# Patient Record
Sex: Male | Born: 2000 | Race: Black or African American | Hispanic: No | Marital: Single | Smoking: Never smoker
Health system: Southern US, Community
[De-identification: ages and names within clinical notes are randomized; demographics above are authoritative.]

## PROBLEM LIST (undated history)

## (undated) DIAGNOSIS — K5090 Crohn's disease, unspecified, without complications: Secondary | ICD-10-CM

## (undated) DIAGNOSIS — K589 Irritable bowel syndrome without diarrhea: Secondary | ICD-10-CM

## (undated) HISTORY — PX: FOOT SURGERY: SHX648

## (undated) HISTORY — PX: COLONOSCOPY: SHX174

---
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
## 2019-01-27 IMAGING — CR DG SI JOINTS 3+V
4 series · 4 of 4 positions shown · non-contrast
Comparison: Lumbar spine 06/19/2016.

CLINICAL DATA: Back pain.

EXAM:
BILATERAL SACROILIAC JOINTS - 3+ VIEW

[si joints ap]
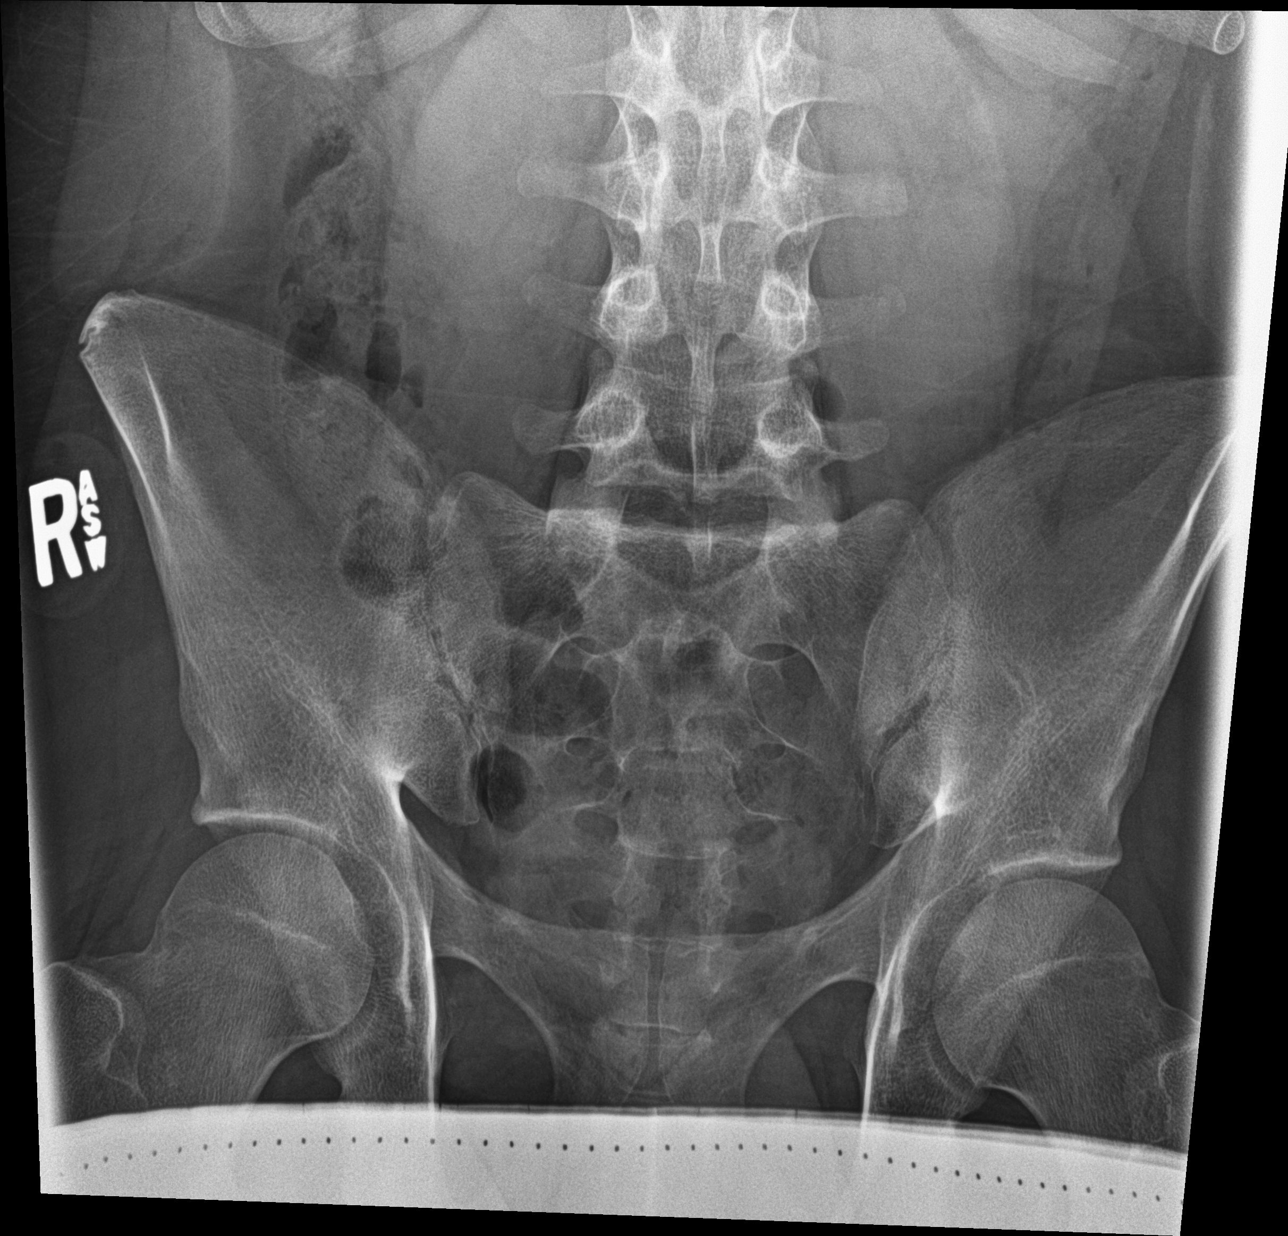

[si joints obl (1 of 3)]
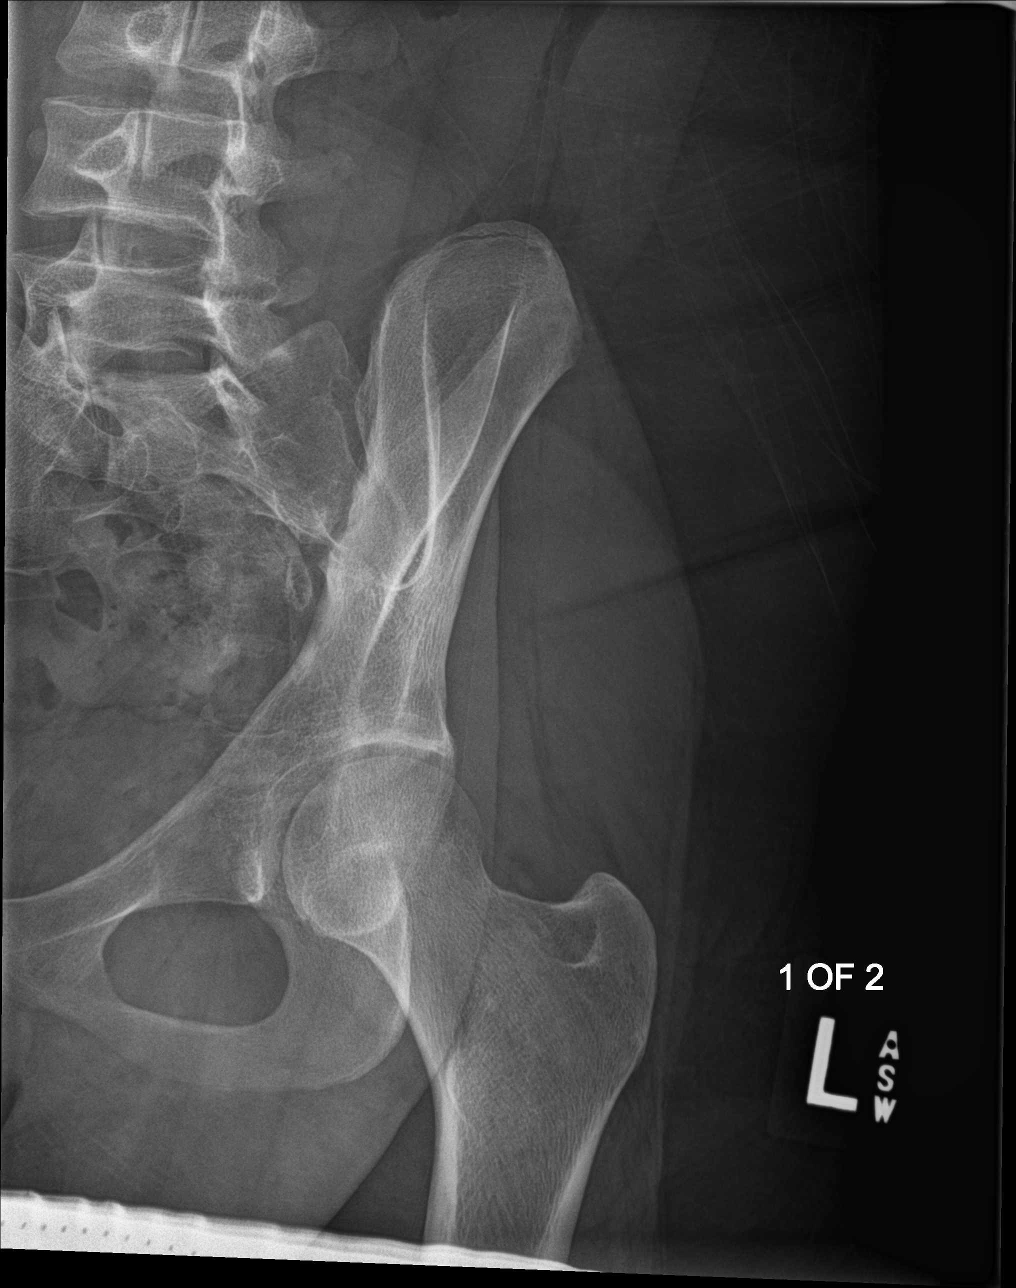

[si joints obl (2 of 3)]
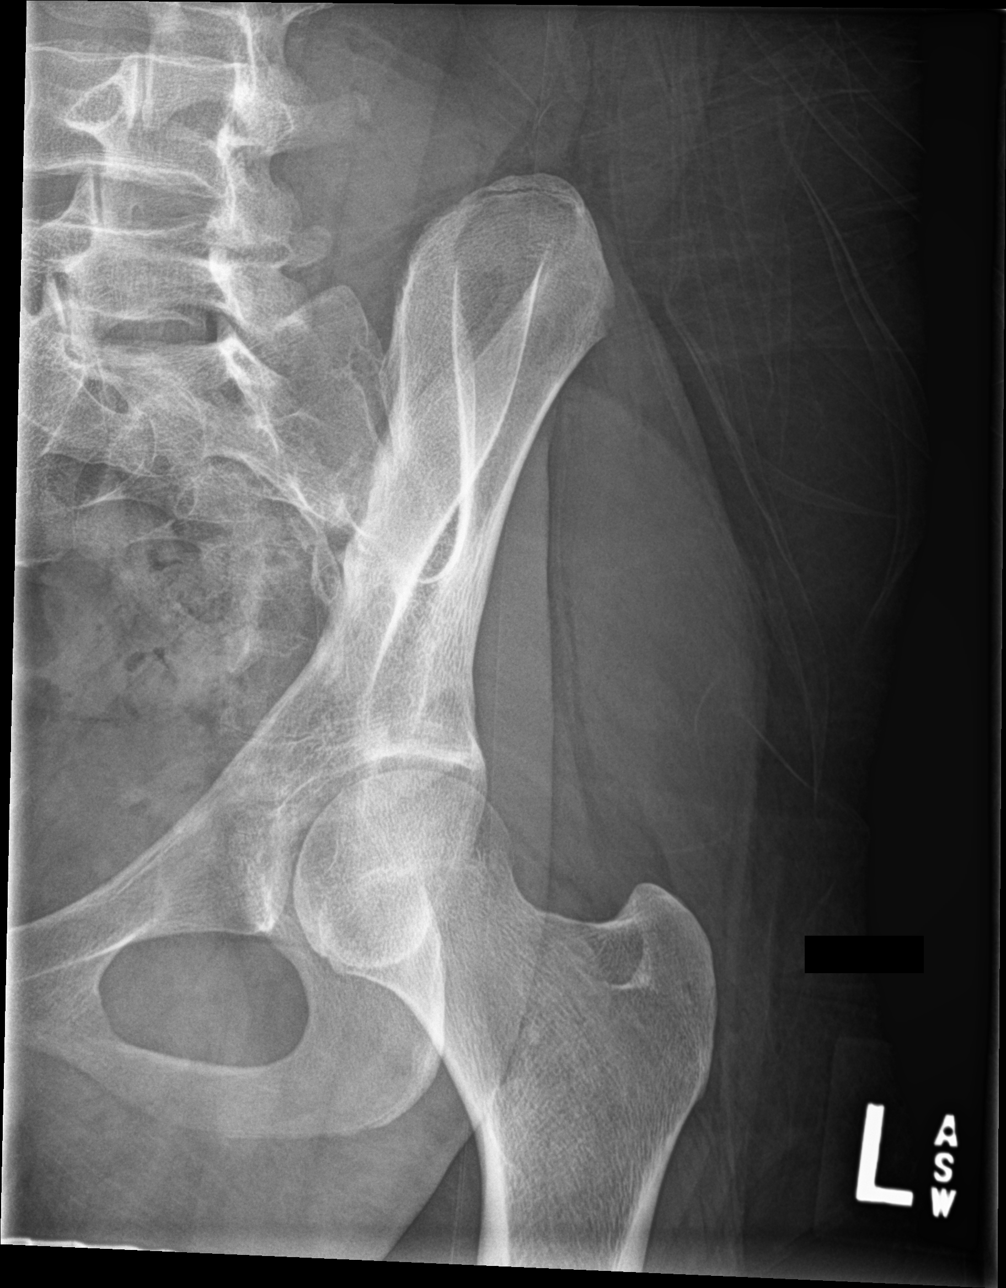

[si joints obl (3 of 3)]
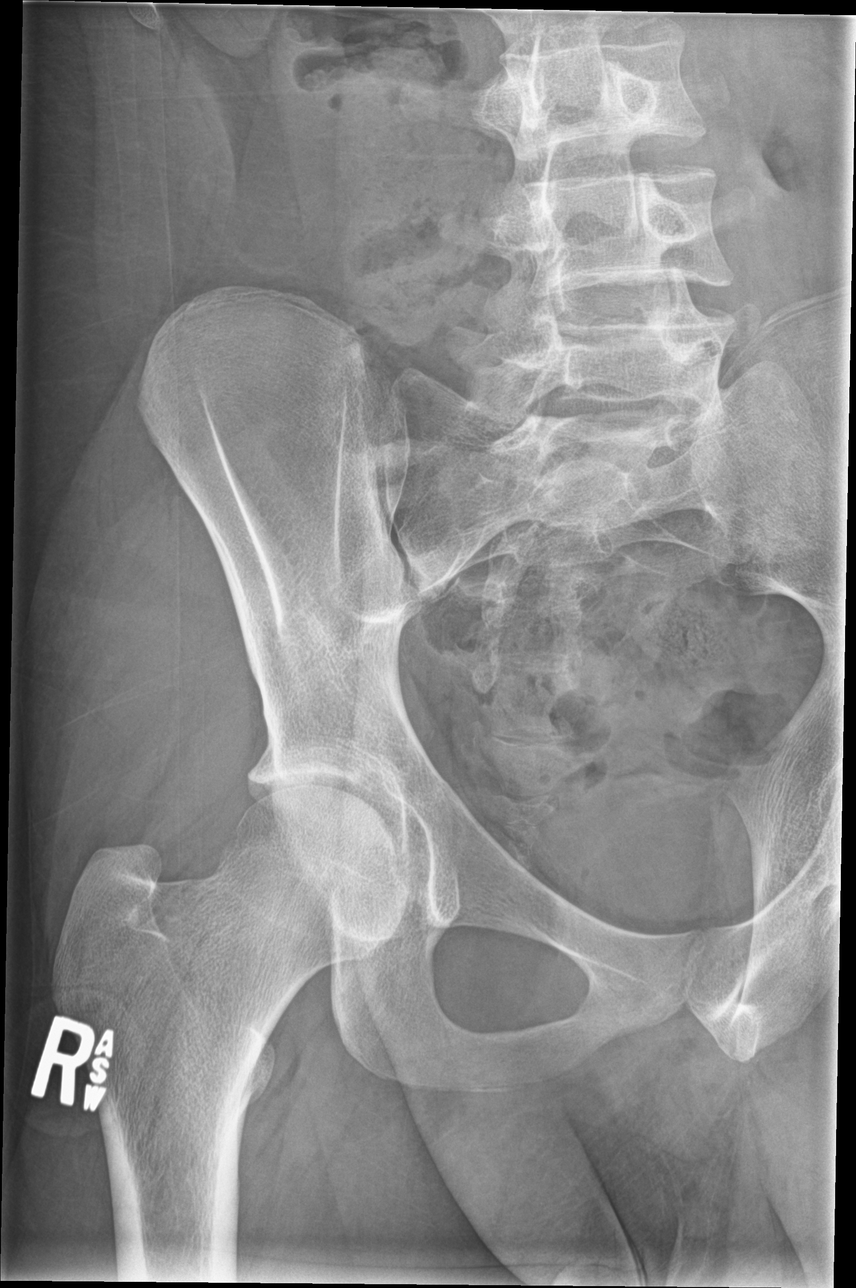

[4 of 4 positions shown; findings below may reference images not displayed]

FINDINGS: No acute bony abnormality identified. No evidence of fracture. No
evidence of dislocation tiny sclerotic density noted over the left
femur most likely tiny bone island..
IMPRESSION: No acute abnormality.

## 2019-07-13 ENCOUNTER — Ambulatory Visit (INDEPENDENT_AMBULATORY_CARE_PROVIDER_SITE_OTHER): Payer: Medicaid Other | Admitting: Child and Adolescent Psychiatry

## 2019-07-13 ENCOUNTER — Telehealth: Payer: Self-pay | Admitting: Child and Adolescent Psychiatry

## 2019-07-13 ENCOUNTER — Encounter: Payer: Self-pay | Admitting: Child and Adolescent Psychiatry

## 2019-07-13 ENCOUNTER — Other Ambulatory Visit: Payer: Self-pay

## 2019-07-13 DIAGNOSIS — F332 Major depressive disorder, recurrent severe without psychotic features: Secondary | ICD-10-CM | POA: Diagnosis not present

## 2019-07-13 DIAGNOSIS — F418 Other specified anxiety disorders: Secondary | ICD-10-CM | POA: Diagnosis not present

## 2019-07-13 MED ORDER — HYDROXYZINE HCL 25 MG PO TABS: 25.0000 mg | ORAL_TABLET | Freq: Three times a day (TID) | ORAL | 0 refills | Status: DC | PRN

## 2019-07-13 MED ORDER — FLUOXETINE HCL 10 MG PO CAPS: 10.0000 mg | ORAL_CAPSULE | Freq: Every day | ORAL | 0 refills | Status: DC

## 2019-07-13 NOTE — Telephone Encounter (Signed)
Created in error

## 2019-07-13 NOTE — Progress Notes (Signed)
Virtual Visit via Video Note  I connected with Scott Soto on 07/13/19 at 11:00 AM EDT by a video enabled telemedicine application and verified that I am speaking with the correct person using two identifiers.  Location: Patient: home Provider: office   I discussed the limitations of evaluation and management by telemedicine and the availability of in person appointments. The patient expressed understanding and agreed to proceed.    I discussed the assessment and treatment plan with the patient. The patient was provided an opportunity to ask questions and all were answered. The patient agreed with the plan and demonstrated an understanding of the instructions.   The patient was advised to call back or seek an in-person evaluation if the symptoms worsen or if the condition fails to improve as anticipated.  I provided 60 minutes of non-face-to-face time during this encounter.   Orlene Erm, MD    [facility] MD/PA/NP OP Progress Note  07/13/2019 1:49 PM Zaelyn Barbary  MRN:  [id number]  Chief Complaint: Depression and Anxiety HPI:   Scott Soto "Scott Soto" is a 19 years old biracial male with medical history significant of Crohn's disease and psychiatric history significant of depression and anxiety.  Patient was seen once for initial evaluation and September 2019 for depression and anxiety on the referral from PCP and was recommended to start taking Lexapro 5 mg once a day and see individual therapist once every week.  Patient did not follow up after the initial evaluation and had 1 visit with therapist in 2019.  Patient had 3 no-shows after his initial visits and mother recently reached out again to PCP for referral to this clinic for worsening of anxiety and depression.   In the interim since the last visit in 2019 patient continued to receive treatment through [facility] for Crohn's disease, and currently on Humira injection every other week.  His PCP also tried Zoloft in 2020 and  was prescribed up to 50 mg once a day.   He was seen and evaluated today over telemedicine encounter.  He reports that for the past 7 to 8 months he has noticed a worsening of depression and anxiety.  He reports that he is mood is depressed on most days, spends most of the time in his room laying on the bed, some days he sleeps about 4 to 5 hours and some days he sleeps for about 10 to 12 hours, reports poor energy, lack of motivation and feelings of worthlessness.  He has not been playing video games, has not been meeting his friends, talks to his 1-2 friends over the phone. He denies any suicidal thoughts or self-harm thoughts and denies any AVH.  He did not admit any delusions.  He also reports worsening of anxiety in the context of worrying about his school and future, worrying about his stomach problems.  He also reports that his anxiety also makes his stomach problems worse.  His major stressor remain his inflammatory bowel disease, constant stomach issues.   His mother provided collateral information and reports that Scott Soto has not been doing well in regards of depression and anxiety and they have significantly worsened over the past 6 to 7 months. She reports that patient has been very depressed, he is not taking care of himself including taking showers, has not been eating because of his stomach problems, and losing weight, stays in the bed, and has not been compliant to his medications. She also reprots that he has been having a lot of anxiety. She reports  that over the weekend she found him crying in the closet and hit his head. She reprots that pt has not expressed any suicidal thought/self harm thoughts. She reports that after repeated convincing he agreed to see this Probation officer and start treatment.   Visit Diagnosis:    ICD-10-CM   1. Severe episode of recurrent major depressive disorder, without psychotic features ([place])  F33.2   2. Other specified anxiety disorders  F41.8     Past  Psychiatric History: [name] reported that he went to [facility] once but did not follow up and has not seen a psychiatrist or therapist except that one visit. He denied any hx of previous psychiatric hospitalization, SI/violence. Saw this provider once in 2019 and therapist at [facility] in 2019.   Medication trials include Lexapro and Zoloft but never took long enough. He is also on Elavil but does not take it, it was prescribed by his GI doctor.   Past Medical History: No past medical history on file.  Past Surgical History:  Procedure Laterality Date  . FOOT SURGERY Left     Family Psychiatric History: As mentioned in initial H&P, reviewed today, no change   Family History:  Family History  Problem Relation Age of Onset  . Depression Mother   . Anxiety disorder Mother   . Anxiety disorder Sister   . Depression Sister     Social History: Continues to live with his mother, mother is on disability and always home with pt. He is in [facility] in [place], doing school virtually.   Social History   Socioeconomic History  . Marital status: Single    Spouse name: Not on file  . Number of children: Not on file  . Years of education: Not on file  . Highest education level: Some college, no degree  Occupational History  . Not on file  Tobacco Use  . Smoking status: Never Smoker  . Smokeless tobacco: Never Used  Substance and Sexual Activity  . Alcohol use: Never  . Drug use: Yes    Types: Marijuana  . Sexual activity: Yes    Birth control/protection: Condom  Other Topics Concern  . Not on file  Social History Narrative  . Not on file   Social Determinants of Health   Financial Resource Strain:   . Difficulty of Paying Living Expenses:   Food Insecurity:   . Worried About Charity fundraiser in the Last Year:   . Arboriculturist in the Last Year:   Transportation Needs:   . Film/video editor (Medical):   Marland Kitchen Lack of Transportation (Non-Medical):   Physical Activity:   .  Days of Exercise per Week:   . Minutes of Exercise per Session:   Stress:   . Feeling of Stress :   Social Connections:   . Frequency of Communication with Friends and Family:   . Frequency of Social Gatherings with Friends and Family:   . Attends Religious Services:   . Active Member of Clubs or Organizations:   . Attends Archivist Meetings:   Marland Kitchen Marital Status:     Allergies:  Allergies  Allergen Reactions  . Amoxicillin Hives    Fever, redness and burning sensation throughout body.  Last episode pt went to ICU.  Fever, redness and burning sensation throughout body.  Last episode pt went to ICU.    Marland Kitchen Sulfa Antibiotics Hives    Fever, redness and burning sensation throughout body.  Last episode pt went to  ICU.  Fever, redness and burning sensation throughout body.  Last episode pt went to ICU.    . Tape Rash    Silk and plastic tape makes his  skin comes peel off. Silk and plastic tape makes his  skin comes peel off.     Metabolic Disorder Labs: No results found for: HGBA1C, MPG No results found for: PROLACTIN No results found for: CHOL, TRIG, HDL, CHOLHDL, VLDL, LDLCALC No results found for: TSH  Therapeutic Level Labs: No results found for: LITHIUM No results found for: VALPROATE No components found for:  CBMZ  Current Medications: Current Outpatient Medications  Medication Sig Dispense Refill  . Adalimumab 40 MG/0.4ML PNKT Inject into the skin.    Marland Kitchen amitriptyline (ELAVIL) 25 MG tablet Take 25 mg by mouth at bedtime.    . cyanocobalamin 1000 MCG tablet Take by mouth.    . pantoprazole (PROTONIX) 40 MG tablet Take by mouth.    . predniSONE (DELTASONE) 5 MG tablet TAKE 1 AND 1/2 TABLETS BY MOUTH ONCE DAILY    . cetirizine (ZYRTEC) 10 MG tablet Take by mouth.    . hyoscyamine (LEVSIN SL) 0.125 MG SL tablet Place under the tongue.    . inFLIXimab (REMICADE) 100 MG injection Inject into the vein.    . Nutritional Supplements (ENSURE NUTRITION SHAKE) LIQD  Take by mouth.    Marland Kitchen omeprazole (PRILOSEC) 20 MG capsule Take by mouth.    . predniSONE (DELTASONE) 10 MG tablet Take 20 mg by mouth daily.  1  . PROAIR HFA 108 (90 Base) MCG/ACT inhaler TAKE 2 PUFFS BY MOUTH EVERY 4 HOURS AS NEEDED  9  . tretinoin (RETIN-A) 0.01 % gel Apply topically.     No current facility-administered medications for this visit.     Musculoskeletal: Strength & Muscle Tone: unable to assess since visit was over the telemedicine. Gait & Station: unable to assess since visit was over the telemedicine. Patient leans: N/A  Psychiatric Specialty Exam: Review of Systems  There were no vitals taken for this visit.There is no height or weight on file to calculate BMI.  General Appearance: Casual and wearing hoodie  Eye Contact:  Fair  Speech:  Clear and Coherent and normal rate  Volume:  Decreased  Mood:  "depressed.."  Affect:  Appropriate, Congruent and Constricted  Thought Process:  Goal Directed and Linear  Orientation:  Full (Time, Place, and Person)  Thought Content: Logical   Suicidal Thoughts:  No  Homicidal Thoughts:  No  Memory:  Immediate;   Fair Recent;   Fair Remote;   Fair  Judgement:  Fair  Insight:  Shallow  Psychomotor Activity:  fidgeting  Concentration:  Concentration: Fair and Attention Span: Fair  Recall:  [employer] of Knowledge: Fair  Language: Fair  Akathisia:  No    AIMS (if indicated): not done  Assets:  Communication Skills Desire for Improvement Financial Resources/Insurance Housing Leisure Time Physical Health Social Support Transportation Vocational/Educational  ADL's:  Intact  Cognition: WNL  Sleep:  Fair   Screenings:   Assessment and Plan: Scott Soto "Scott Soto" is a 19 year old biracial male with medical history significant of Crohn's disease and psychiatric history significant of depression and anxiety. He was seen in 2019 for depression and anxiety but never followed up. He is now re-referred by PCP for  psychiatric evaluation and med management for depression and anxiety.  Never gave a full trial to either Zoloft or Lexapro.   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 He endorses symptoms of anxiety and depression which seems to have precipitated after the diagnosis of Crohn's disease and  related psychosocial adjustment. Symptoms appears to have perpetuated and worsened in the context maladaptive coping, his lack of following up with recommended psychiatric treatment and intermittent IBD associated pain.  It appears that symptoms have progressively worsened since his last visit in 2019 and particularly since past 6-7 months in the context of his ongoing GI problems, social isolation due to [place]. Recommending restarting SSRI and ind therapy.  Plan  # Depression(recurrent, severe) - Start Prozac 10 mg daily. Recommending slow titration given hx of GI problems  - Start Ind therapy, referred back to his previous therapist at [place], Ms. Cecilie Lowers.  - Recommended mother to get pt involved in support group for crohn's diseases, discussed resources.  - Writer discussed treatment recommendation, provided psychoeducation to pt at a length and he agreed with the plan.    # Anxiety (chronic, worse) - Same as above.  - Start Atarax 25 mg TID PRN for anxiety  # Safety A suicide and violence risk assessment was performed as part of this evaluation. The patient is deemed to be at chronic elevated risk for self-harm/suicide given the following factors: current diagnosis of MDD and Anxiety Disorders, Chronic medical condition.  These risk factors are mitigated by the following factors:lack of active SI/HI, no know access to weapons or firearms, no history of previous suicide attempts , no history of violence, motivation for treatment, supportive family, presence of an available support system, employment or functioning in a structured work/academic setting, safe housing and support  system in agreement with treatment recommendations. There is no acute risk for suicide or violence at this time. The patient was educated about relevant modifiable risk factors including following recommendations for treatment of psychiatric illness and abstaining from substance abuse. While future psychiatric events cannot be accurately predicted, the patient does not request acute inpatient psychiatric care and does not currently meet [facility] involuntary commitment criteria.      Follow up in 1-2 weeks or early if symptoms worsens/fail to improve.   Total time spent of date of service was 60 minutes.  Patient care activities included preparing to see the patient such as reviewing the patient's record, obtaining and/or living separately obtain history, performing a medically appropriate history and mental status examination, counseling and educating the patient, family, and over the caregiver, ordering prescription medications, referring and communicating with other healthcare providers when not separately reported during the visit, documenting clinical information in the electronic for other health record, communicating results to the patient/family/caregiver and coordinating the care of the patient when not separately reported.     Orlene Erm, MD 07/13/2019, 1:49 PM

## 2019-07-28 ENCOUNTER — Ambulatory Visit (INDEPENDENT_AMBULATORY_CARE_PROVIDER_SITE_OTHER): Payer: Medicaid Other | Admitting: Child and Adolescent Psychiatry

## 2019-07-28 ENCOUNTER — Encounter: Payer: Self-pay | Admitting: Child and Adolescent Psychiatry

## 2019-07-28 ENCOUNTER — Other Ambulatory Visit: Payer: Self-pay

## 2019-07-28 DIAGNOSIS — E559 Vitamin D deficiency, unspecified: Secondary | ICD-10-CM

## 2019-07-28 DIAGNOSIS — F332 Major depressive disorder, recurrent severe without psychotic features: Secondary | ICD-10-CM

## 2019-07-28 DIAGNOSIS — F418 Other specified anxiety disorders: Secondary | ICD-10-CM | POA: Diagnosis not present

## 2019-07-28 MED ORDER — TRAZODONE HCL 50 MG PO TABS: 50.0000 mg | ORAL_TABLET | Freq: Every day | ORAL | 0 refills | Status: DC

## 2019-07-28 MED ORDER — FLUOXETINE HCL 20 MG PO CAPS: 20.0000 mg | ORAL_CAPSULE | Freq: Every day | ORAL | 0 refills | Status: DC

## 2019-07-28 NOTE — Progress Notes (Signed)
Virtual Visit via Video Note  I connected with Scott Soto on 07/28/19 at  2:00 PM EDT by a video enabled telemedicine application and verified that I am speaking with the correct person using two identifiers.  Location: Patient: home Provider: office   I discussed the limitations of evaluation and management by telemedicine and the availability of in person appointments. The patient expressed understanding and agreed to proceed.    I discussed the assessment and treatment plan with the patient. The patient was provided an opportunity to ask questions and all were answered. The patient agreed with the plan and demonstrated an understanding of the instructions.   The patient was advised to call back or seek an in-person evaluation if the symptoms worsen or if the condition fails to improve as anticipated.  I provided 60 minutes of non-face-to-face time during this encounter.   Scott Erm, MD    [facility] MD/PA/NP OP Progress Note  07/28/2019 3:15 PM Scott Soto  MRN:  [id number]  Chief Complaint: Medication Management for Depression and Anxiety  Synopsis : Scott Soto "Scott Soto" is a 19 years old biracial male with medical history significant of Crohn's disease and psychiatric history significant of depression and anxiety.  Patient was seen once for initial evaluation and September 2019 for depression and anxiety on the referral from PCP and was recommended to start taking Lexapro 5 mg once a day and see individual therapist once every week.  Patient did not follow up after the initial evaluation and had 1 visit with therapist in 2019.  Patient had 3 no-shows after his initial visits and mother recently reached out again to PCP for referral to this clinic for worsening of anxiety and depression.   In the interim since the last visit in 2019 patient continued to receive treatment through [facility] for [facility]hn's disease, and currently on Humira injection every other week.  His PCP  also tried Zoloft in 2020 and was prescribed up to 50 mg once a day.   HPI: Patient was seen and evaluated over telemedicine encounter.  He was present with his mother at his home and mother remain present during the appointment.  He reports that he has not noticed any side effects with Prozac and he has been taking it every day.  He however denies any improvement with his symptoms, continues to feel depressed, not motivated, spending most of the time in the room, has difficulties with sleep and therefore he does his online school work at night, he reports that he has been hearing voices which he reports that it is his own voice.  He reports that his voice is self-critical.  He denies voices thinking to hurt himself and denies any thoughts of suicide or violence.  He denies any AVH, did not admit any delusions.  He also reports that he continues to remain anxious.  He reports that he does not want to get bothered by other people and therefore sometimes he goes to his closet and lays there because he does not want to get bothered by his mother.  He reports that he has been staying at home most of the time except feeling his dog once a week during which he may be going outside.  His mother reports that she has not seen any improvement or change as compared to last visit, continues to remain depressed, staying in his room, does not want to get bothered.  She reports that he appears to have more stomach problems because he has been using  bathroom more than usual.  We discussed to increase Prozac to 20 mg once a day, and try trazodone 50 mg at bedtime.  Both patient and parent verbalized understanding and agreed to it.   Visit Diagnosis:    ICD-10-CM   1. Severe episode of recurrent major depressive disorder, without psychotic features (HCC)  F33.2 FLUoxetine (PROZAC) 20 MG capsule  2. Other specified anxiety disorders  F41.8 FLUoxetine (PROZAC) 20 MG capsule    Past Psychiatric History: Scott Soto reported  that he went to [facility] once but did not follow up and has not seen a psychiatrist or therapist except that one visit. He denied any hx of previous psychiatric hospitalization, SI/violence. Saw this provider once in 2019 and therapist at [facility] in 2019.   Medication trials include Lexapro and Zoloft but never took long enough. He is also on Elavil but does not take it, it was prescribed by his GI doctor.   Past Medical History: No past medical history on file.  Past Surgical History:  Procedure Laterality Date  . FOOT SURGERY Left     Family Psychiatric History: As mentioned in initial H&P, reviewed today, no change   Family History:  Family History  Problem Relation Age of Onset  . Depression Mother   . Anxiety disorder Mother   . Anxiety disorder Sister   . Depression Sister     Social History: Continues to live with his mother, mother is on disability and always home with pt. He is in [employer] in [place], doing school virtually.   Social History   Socioeconomic History  . Marital status: Single    Spouse name: Not on file  . Number of children: Not on file  . Years of education: Not on file  . Highest education level: Some college, no degree  Occupational History  . Not on file  Tobacco Use  . Smoking status: Never Smoker  . Smokeless tobacco: Never Used  Substance and Sexual Activity  . Alcohol use: Never  . Drug use: Yes    Types: Marijuana  . Sexual activity: Yes    Birth control/protection: Condom  Other Topics Concern  . Not on file  Social History Narrative  . Not on file   Social Determinants of Health   Financial Resource Strain:   . Difficulty of Paying Living Expenses:   Food Insecurity:   . Worried About Charity fundraiser in the Last Year:   . Arboriculturist in the Last Year:   Transportation Needs:   . Film/video editor (Medical):   Marland Kitchen Lack of Transportation (Non-Medical):   Physical Activity:   . Days of Exercise per Week:   .  Minutes of Exercise per Session:   Stress:   . Feeling of Stress :   Social Connections:   . Frequency of Communication with Friends and Family:   . Frequency of Social Gatherings with Friends and Family:   . Attends Religious Services:   . Active Member of Clubs or Organizations:   . Attends Archivist Meetings:   Marland Kitchen Marital Status:     Allergies:  Allergies  Allergen Reactions  . Amoxicillin Hives    Fever, redness and burning sensation throughout body.  Last episode pt went to ICU.  Fever, redness and burning sensation throughout body.  Last episode pt went to ICU.    Marland Kitchen Sulfa Antibiotics Hives    Fever, redness and burning sensation throughout body.  Last episode pt went to  ICU.  Fever, redness and burning sensation throughout body.  Last episode pt went to ICU.    . Tape Rash    Silk and plastic tape makes his  skin comes peel off. Silk and plastic tape makes his  skin comes peel off.     Metabolic Disorder Labs: No results found for: HGBA1C, MPG No results found for: PROLACTIN No results found for: CHOL, TRIG, HDL, CHOLHDL, VLDL, LDLCALC No results found for: TSH  Therapeutic Level Labs: No results found for: LITHIUM No results found for: VALPROATE No components found for:  CBMZ  Current Medications: Current Outpatient Medications  Medication Sig Dispense Refill  . Adalimumab 40 MG/0.4ML PNKT Inject into the skin.    . cetirizine (ZYRTEC) 10 MG tablet Take by mouth.    . cyanocobalamin 1000 MCG tablet Take by mouth.    Marland Kitchen FLUoxetine (PROZAC) 20 MG capsule Take 1 capsule (20 mg total) by mouth daily. 30 capsule 0  . hydrOXYzine (ATARAX/VISTARIL) 25 MG tablet Take 1 tablet (25 mg total) by mouth 3 (three) times daily as needed for anxiety. 30 tablet 0  . hyoscyamine (LEVSIN SL) 0.125 MG SL tablet Place under the tongue.    . inFLIXimab (REMICADE) 100 MG injection Inject into the vein.    . Nutritional Supplements (ENSURE NUTRITION SHAKE) LIQD Take by  mouth.    Marland Kitchen omeprazole (PRILOSEC) 20 MG capsule Take by mouth.    . pantoprazole (PROTONIX) 40 MG tablet Take by mouth.    . predniSONE (DELTASONE) 10 MG tablet Take 20 mg by mouth daily.  1  . predniSONE (DELTASONE) 5 MG tablet TAKE 1 AND 1/2 TABLETS BY MOUTH ONCE DAILY    . PROAIR HFA 108 (90 Base) MCG/ACT inhaler TAKE 2 PUFFS BY MOUTH EVERY 4 HOURS AS NEEDED  9  . traZODone (DESYREL) 50 MG tablet Take 1 tablet (50 mg total) by mouth at bedtime. 30 tablet 0  . tretinoin (RETIN-A) 0.01 % gel Apply topically.     No current facility-administered medications for this visit.     Musculoskeletal: Strength & Muscle Tone: unable to assess since visit was over the telemedicine. Gait & Station: unable to assess since visit was over the telemedicine. Patient leans: N/A  Psychiatric Specialty Exam: Review of Systems  There were no vitals taken for this visit.There is no height or weight on file to calculate BMI.  General Appearance: Casual and wearing hoodie  Eye Contact:  Fair  Speech:  Clear and Coherent and normal rate  Volume:  Decreased  Mood:  "depressed.."  Affect:  Appropriate, Congruent and Flat  Thought Process:  Goal Directed and Linear  Orientation:  Full (Time, Place, and Person)  Thought Content: Logical   Suicidal Thoughts:  No  Homicidal Thoughts:  No  Memory:  Immediate;   Fair Recent;   Fair Remote;   Fair  Judgement:  Fair  Insight:  Shallow  Psychomotor Activity:  Normal  Concentration:  Concentration: Fair and Attention Span: Fair  Recall:  [employer] of Knowledge: Fair  Language: Fair  Akathisia:  No    AIMS (if indicated): not done  Assets:  Communication Skills Desire for Improvement Financial Resources/Insurance Housing Leisure Time Physical Health Social Support Transportation Vocational/Educational  ADL's:  Intact  Cognition: WNL  Sleep:  Fair   Screenings:   Assessment and Plan: Shivam Mestas "Scott Soto" is a 19 year old biracial male with  medical history significant of Crohn's disease and psychiatric history significant of depression and anxiety.  He was seen in 2019 for depression and anxiety but never followed up. He is now re-referred by PCP for psychiatric evaluation and med management for depression and anxiety.  Never gave a full trial to either Zoloft or Lexapro.   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 He endorses symptoms of anxiety and depression which seems to have precipitated after the diagnosis of Crohn's disease and  related psychosocial adjustment. Symptoms appears to have perpetuated and worsened in the context maladaptive coping, his lack of following up with recommended psychiatric treatment and intermittent IBD associated pain.  It appears that symptoms have progressively worsened since his last visit in 2019 and particularly since past 6-7 months in the context of his ongoing GI problems, social isolation due to [place]. Started on Prozac, appears to be tolerating well without improvement.   Plan  # Depression(recurrent, severe) - Increase  Prozac 20 mg daily. Recommending slow titration given hx of GI problems  - Start Ind therapy, referred back to his previous therapist at [facility], Ms. Cecilie Lowers, has an appointment on 04/06 - Recommended mother to get pt involved in support group for crohn's diseases, discussed resources.  - Writer discussed treatment recommendation, discussed that pt may need more intensive outpatient treatment and recommended referral to PHP/[facility]OP at [facility]. M agreed for referral. Referral sent. Provided psychoeducation to pt at a length and he agreed with the plan.  - CBC; CMP; TSH; Vitamin D and B12 ordered.   # Anxiety (chronic, worse) - Same as above.  - Start Atarax 25 mg TID PRN for anxiety  # Sleeping difficulties - Start Trazodone 50 mg QHS.   # Safety A suicide and violence risk assessment was performed as part of this evaluation. The patient  is deemed to be at chronic elevated risk for self-harm/suicide given the following factors: current diagnosis of MDD and Anxiety Disorders, Chronic medical condition.  These risk factors are mitigated by the following factors:lack of active SI/HI, no know access to weapons or firearms, no history of previous suicide attempts , no history of violence, motivation for treatment, supportive family, presence of an available support system, employment or functioning in a structured work/academic setting, safe housing and support system in agreement with treatment recommendations. There is no acute risk for suicide or violence at this time. The patient was educated about relevant modifiable risk factors including following recommendations for treatment of psychiatric illness and abstaining from substance abuse. While future psychiatric events cannot be accurately predicted, the patient does not request acute inpatient psychiatric care and does not currently meet [facility] involuntary commitment criteria.      Follow up in 1-2 weeks or early if symptoms worsens/fail to improve.   Total time spent of date of service was 45 minutes.  Patient care activities included preparing to see the patient such as reviewing the patient's record, obtaining and/or living separately obtain history, performing a medically appropriate history and mental status examination, counseling and educating the patient, family, and over the caregiver, ordering prescription medications, referring and communicating with other healthcare providers when not separately reported during the visit, documenting clinical information in the electronic for other health record, communicating results to the patient/family/caregiver and coordinating the care of the patient when not separately reported.     Scott Erm, MD 07/28/2019, 3:15 PM

## 2019-07-29 ENCOUNTER — Telehealth (HOSPITAL_COMMUNITY): Payer: Self-pay | Admitting: Professional

## 2019-08-03 ENCOUNTER — Other Ambulatory Visit: Payer: Self-pay

## 2019-08-03 ENCOUNTER — Encounter: Payer: Self-pay | Admitting: Licensed Clinical Social Worker

## 2019-08-03 ENCOUNTER — Ambulatory Visit (INDEPENDENT_AMBULATORY_CARE_PROVIDER_SITE_OTHER): Payer: Medicaid Other | Admitting: Licensed Clinical Social Worker

## 2019-08-03 DIAGNOSIS — F332 Major depressive disorder, recurrent severe without psychotic features: Secondary | ICD-10-CM | POA: Diagnosis not present

## 2019-08-03 NOTE — Progress Notes (Signed)
Virtual Visit via Telephone Note  I connected with Scott Soto on 08/03/19 at  1:30 PM EDT by telephone and verified that I am speaking with the correct person using two identifiers.   I discussed the limitations, risks, security and privacy concerns of performing an evaluation and management service by telephone and the availability of in person appointments. I also discussed with the patient that there may be a patient responsible charge related to this service. The patient expressed understanding and agreed to proceed.     I discussed the assessment and treatment plan with the patient. The patient was provided an opportunity to ask questions and all were answered. The patient agreed with the plan and demonstrated an understanding of the instructions.   The patient was advised to call back or seek an in-person evaluation if the symptoms worsen or if the condition fails to improve as anticipated.  I provided 30 minutes of non-face-to-face time during this encounter.   Alden Hipp, LCSW    THERAPIST PROGRESS NOTE  Session Time: 1330 Participation Level: Active  Behavioral Response: NeatAlertAnxious  Type of Therapy: Individual Therapy  Treatment Goals addressed: Coping  Interventions: Supportive  Summary: Scott Soto is a 19 y.o. male who presents with continued symptoms related to his diagnosis. Scott Soto's mother was present for the first ten minutes of Scott Soto's session, and noted Scott Soto was having a bad day. "usually when he's like this, he goes into his closet and tells me to leave him alone, so I'm not sure if he'll talk to you." LCSW validated these ideas and then checked in with Scott Soto. Scott Soto was open to talking to LCSW, and reported he was just having a sad day. He reports not being able to identify what was causing him to be sad, but felt really depressed. LCSW validated this idea, and encouraged Scott Soto to start making a "sad day play list," that he can listen to that will  help put him in a better mood. We also discussed gathering clips from movies or shows that make him laugh. We discussed he would utilize these things when feeling he has woken up in a sad mood, in an effort to change his mood. Scott Soto expressed understanding and agreement. We also discussed how, often when we are depressed, going through the motions of certain things can feel daunting, but can be beneficial in shifting our mood. Scott Soto expressed understanding and agreement.    Suicidal/Homicidal: No  Therapist Response: Scott Soto continues to work towards his tx goals but has not yet reached them. We will continue to work on improving emotional regulation and distress tolerance skills.   Plan: Return again in 2 weeks.  Diagnosis: Axis I: severe episode of MDD    Axis II: No diagnosis    Alden Hipp, LCSW 08/03/2019

## 2019-08-15 ENCOUNTER — Other Ambulatory Visit: Payer: Self-pay | Admitting: Child and Adolescent Psychiatry

## 2019-08-15 DIAGNOSIS — F418 Other specified anxiety disorders: Secondary | ICD-10-CM

## 2019-08-26 ENCOUNTER — Encounter: Payer: Self-pay | Admitting: Child and Adolescent Psychiatry

## 2019-08-26 ENCOUNTER — Telehealth (INDEPENDENT_AMBULATORY_CARE_PROVIDER_SITE_OTHER): Payer: Medicaid Other | Admitting: Child and Adolescent Psychiatry

## 2019-08-26 ENCOUNTER — Other Ambulatory Visit: Payer: Self-pay

## 2019-08-26 DIAGNOSIS — G479 Sleep disorder, unspecified: Secondary | ICD-10-CM

## 2019-08-26 DIAGNOSIS — F332 Major depressive disorder, recurrent severe without psychotic features: Secondary | ICD-10-CM

## 2019-08-26 DIAGNOSIS — F419 Anxiety disorder, unspecified: Secondary | ICD-10-CM

## 2019-08-26 DIAGNOSIS — F418 Other specified anxiety disorders: Secondary | ICD-10-CM

## 2019-08-26 MED ORDER — FLUOXETINE HCL 40 MG PO CAPS: 40.0000 mg | ORAL_CAPSULE | Freq: Every day | ORAL | 0 refills | Status: DC

## 2019-08-26 MED ORDER — HYDROXYZINE HCL 25 MG PO TABS
25.0000 mg | ORAL_TABLET | Freq: Three times a day (TID) | ORAL | 0 refills | Status: AC | PRN
Start: 2019-08-26 — End: ?

## 2019-08-26 MED ORDER — TRAZODONE HCL 50 MG PO TABS: 50.0000 mg | ORAL_TABLET | Freq: Every day | ORAL | 0 refills | Status: AC

## 2019-08-26 NOTE — Progress Notes (Signed)
Virtual Visit via Video Note  I connected with Scott Soto on 08/26/19 at  2:00 PM EDT by a video enabled telemedicine application and verified that I am speaking with the correct person using two identifiers.  Location: Patient: home Provider: office   I discussed the limitations of evaluation and management by telemedicine and the availability of in person appointments. The patient expressed understanding and agreed to proceed.    I discussed the assessment and treatment plan with the patient. The patient was provided an opportunity to ask questions and all were answered. The patient agreed with the plan and demonstrated an understanding of the instructions.   The patient was advised to call back or seek an in-person evaluation if the symptoms worsen or if the condition fails to improve as anticipated.  I provided 40 minutes of non-face-to-face time during this encounter.   Orlene Erm, MD    [facility] MD/PA/NP OP Progress Note  08/26/2019 5:15 PM Scott Soto  MRN:  [id number]  Chief Complaint: Medication management follow-up for depression, anxiety.  Synopsis : Scott Soto "Scott Soto" is a 19 years old biracial male with medical history significant of Crohn's disease and psychiatric history significant of depression and anxiety.  Patient was seen once for initial evaluation and September 2019 for depression and anxiety on the referral from PCP and was recommended to start taking Lexapro 5 mg once a day and see individual therapist once every week.  Patient did not follow up after the initial evaluation and had 1 visit with therapist in 2019.  Patient had 3 no-shows after his initial visits and mother  reached out again to PCP in 2021 for referral to this clinic for worsening of anxiety and depression.   In the interim since the last intake in 2019 patient continued to receive treatment through [facility] for Crohn's disease, and currently on Humira injection every other  week.  His PCP also tried Zoloft in 2020 and was prescribed up to 50 mg once a day.   HPI: Patient was seen and evaluated over telemedicine encounter.  He was accompanied with his mother at his home and was evaluated separately from his mother and with his informed consent writer spoke with his mother for collateral information and discussion of treatment separately.  In the interim since the last appointment he saw [facility] for losing weight, abdominal pain.  Writer reviewed epic chart and was able to review the labs and his vitals.  It appears that his vitals.  Stable except that he lost about 6 pounds over the last 6 months based on their recorded weight yesterday and a visit in October last year.   Results from 08/25/2019  Cortisol 14.1 CRP less than 0.02 CBC within normal limit including H&H of 15.5/48.1 CMP within normal limit Blood glucose 88 Normal iron panel Vitamin D of 29 TSH 3.08 and free T4 0.89 Negative for C. Difficile  He reports that his GI doctor that did not make any changes to his treatment regimen.  He reports that he has continued to have intermittent sharp pain that may last up to a half an hour.  He reports that he is prescribed some medication for pain which sometimes helps.  He also reports that he continues to have diarrhea.  In regards of depression he reports that he continues to feel depressed, reports anhedonia, spends time mostly at home in his room either watching YouTube of someone playing video games, about once a week he plays video games with  his friend online, listens to music.and does his college work.  He reports that his GI doctor recommended not to go to college because it would not be good for him because of his current treatment for Crohn's disease.  He reports that he prefers doing college from home because he does not want to get sick.  He denies any thoughts of suicide or self harm, denies any thoughts of violence, denies AVH. He reports that he also  continues to have anxiety and tends to overthink.  He reports that he goes to bed late at night and sleeps about 10 hours.  He reports that he has been taking his medications regularly and reports that he has not noticed improvement with his depression or anxiety with increasing the dose of Prozac 20 mg.   Writer discussed recommendation to increase Prozac to 40 mg once a day to which he verbalized understanding and agreed with the plan.  Writer also discussed about going out of home for 30 minutes every day on a walk; eat regular meals and play video games or work on his car as much as he can.  Writer provided psychoeducation on this and he agreed.  He had an appointment with therapist in the interim since last appointment with this writer however that therapist has left the office and therefore he is not in any therapy anymore.  He was also referred for intensive outpatient program after the last appointment however, an intensive outpatient program does not take his Medicaid therefore he was not accepted and did not talk to social worker when she called patient to provide other resources.  Writer also discussed today about intensive outpatient program and he reports that he does not like to be in groups and despite encouragement he refused for referral for intensive outpatient program.  He however agreed to follow-up with a counselor whenever the counselor is available at this clinic.  Discussed that it may take some time before the counselor can start working at this clinic and therefore will recommend mother to look into other resources for counseling.  He verbalized understanding.  His mother reports that she has not noticed any significant improvement since last appointment.  She reports that they went to [facility] because he continues to be losing weight and has been having diarrhea.  She reports that they did some blood work and they are waiting to hear back from them.  She also reports that patient was  "very emotional" at the visit with GI and therefore social worker came to speak with him and provided resources such as suicide safety hotline and recommended to continue follow-up with outpatient psychiatry treatment. Writer discussed blood work with patient and mother and discussed that available blood work appears to be within normal limit except borderline low vitamin D.  Discussed about patient's complaint of pain and recommended mother to speak with patient's GI or primary care for pain management.  She verbalized understanding.  Mother reports that she is not sure whether patient is taking his medications as prescribed.  We discussed to have a pillbox in which she can put 7 days worth of medication so that she can track with the patient is compliant to his medications.  He also discussed of increasing the dose of Prozac to 40 mg once a day.  She verbalized understanding.    Visit Diagnosis:    ICD-10-CM   1. Severe episode of recurrent major depressive disorder, without psychotic features (HCC)  F33.2 FLUoxetine (PROZAC) 40 MG  capsule  2. Other specified anxiety disorders  F41.8 FLUoxetine (PROZAC) 40 MG capsule    hydrOXYzine (ATARAX/VISTARIL) 25 MG tablet    Past Psychiatric History: Scott Soto reported that he went to [facility] once but did not follow up and has not seen a psychiatrist or therapist except that one visit. He denied any hx of previous psychiatric hospitalization, SI/violence. Saw this provider once in 2019 and therapist at [facility] in 2019.   Medication trials include Lexapro and Zoloft but never took long enough. He is also on Elavil but does not take it, it was prescribed by his GI doctor.   Past Medical History: No past medical history on file.  Past Surgical History:  Procedure Laterality Date  . FOOT SURGERY Left     Family Psychiatric History: As mentioned in initial H&P, reviewed today, no change   Family History:  Family History  Problem Relation Age of Onset  .  Depression Mother   . Anxiety disorder Mother   . Anxiety disorder Sister   . Depression Sister     Social History: Continues to live with his mother, mother is on disability and always home with pt. He is in [employer] in [place], doing school virtually.   Social History   Socioeconomic History  . Marital status: Single    Spouse name: Not on file  . Number of children: Not on file  . Years of education: Not on file  . Highest education level: Some college, no degree  Occupational History  . Not on file  Tobacco Use  . Smoking status: Never Smoker  . Smokeless tobacco: Never Used  Substance and Sexual Activity  . Alcohol use: Never  . Drug use: Yes    Types: Marijuana  . Sexual activity: Yes    Birth control/protection: Condom  Other Topics Concern  . Not on file  Social History Narrative  . Not on file   Social Determinants of Health   Financial Resource Strain:   . Difficulty of Paying Living Expenses:   Food Insecurity:   . Worried About Charity fundraiser in the Last Year:   . Arboriculturist in the Last Year:   Transportation Needs:   . Film/video editor (Medical):   Marland Kitchen Lack of Transportation (Non-Medical):   Physical Activity:   . Days of Exercise per Week:   . Minutes of Exercise per Session:   Stress:   . Feeling of Stress :   Social Connections:   . Frequency of Communication with Friends and Family:   . Frequency of Social Gatherings with Friends and Family:   . Attends Religious Services:   . Active Member of Clubs or Organizations:   . Attends Archivist Meetings:   Marland Kitchen Marital Status:     Allergies:  Allergies  Allergen Reactions  . Amoxicillin Hives    Fever, redness and burning sensation throughout body.  Last episode pt went to ICU.  Fever, redness and burning sensation throughout body.  Last episode pt went to ICU.    Marland Kitchen Sulfa Antibiotics Hives    Fever, redness and burning sensation throughout body.  Last episode pt went to  ICU.  Fever, redness and burning sensation throughout body.  Last episode pt went to ICU.    . Tape Rash    Silk and plastic tape makes his  skin comes peel off. Silk and plastic tape makes his  skin comes peel off.     Metabolic Disorder Labs:  No results found for: HGBA1C, MPG No results found for: PROLACTIN No results found for: CHOL, TRIG, HDL, CHOLHDL, VLDL, LDLCALC No results found for: TSH  Therapeutic Level Labs: No results found for: LITHIUM No results found for: VALPROATE No components found for:  CBMZ  Current Medications: Current Outpatient Medications  Medication Sig Dispense Refill  . Adalimumab 40 MG/0.4ML PNKT Inject into the skin.    . cetirizine (ZYRTEC) 10 MG tablet Take by mouth.    . cyanocobalamin 1000 MCG tablet Take by mouth.    Marland Kitchen FLUoxetine (PROZAC) 40 MG capsule Take 1 capsule (40 mg total) by mouth daily. 30 capsule 0  . hydrOXYzine (ATARAX/VISTARIL) 25 MG tablet Take 1 tablet (25 mg total) by mouth 3 (three) times daily as needed for anxiety. 30 tablet 0  . hyoscyamine (LEVSIN SL) 0.125 MG SL tablet Place under the tongue.    . inFLIXimab (REMICADE) 100 MG injection Inject into the vein.    . Nutritional Supplements (ENSURE NUTRITION SHAKE) LIQD Take by mouth.    Marland Kitchen omeprazole (PRILOSEC) 20 MG capsule Take by mouth.    . pantoprazole (PROTONIX) 40 MG tablet Take by mouth.    . predniSONE (DELTASONE) 10 MG tablet Take 20 mg by mouth daily.  1  . predniSONE (DELTASONE) 5 MG tablet TAKE 1 AND 1/2 TABLETS BY MOUTH ONCE DAILY    . PROAIR HFA 108 (90 Base) MCG/ACT inhaler TAKE 2 PUFFS BY MOUTH EVERY 4 HOURS AS NEEDED  9  . traZODone (DESYREL) 50 MG tablet Take 1 tablet (50 mg total) by mouth at bedtime. 30 tablet 0  . tretinoin (RETIN-A) 0.01 % gel Apply topically.     No current facility-administered medications for this visit.     Musculoskeletal: Strength & Muscle Tone: unable to assess since visit was over the telemedicine. Gait & Station: unable  to assess since visit was over the telemedicine. Patient leans: N/A  Psychiatric Specialty Exam: Review of Systems  There were no vitals taken for this visit.There is no height or weight on file to calculate BMI.  General Appearance: Casual and wearing hoodie  Eye Contact:  Fair  Speech:  Clear and Coherent and Slow  Volume:  Decreased  Mood:  "depressed.."  Affect:  Appropriate, Congruent and Flat  Thought Process:  Goal Directed and Linear  Orientation:  Full (Time, Place, and Person)  Thought Content: Logical   Suicidal Thoughts:  No  Homicidal Thoughts:  No  Memory:  Immediate;   Fair Recent;   Fair Remote;   Fair  Judgement:  Fair  Insight:  Shallow  Psychomotor Activity:  Normal  Concentration:  Concentration: Fair and Attention Span: Fair  Recall:  [employer] of Knowledge: Fair  Language: Fair  Akathisia:  No    AIMS (if indicated): not done  Assets:  Communication Skills Desire for Improvement Financial Resources/Insurance Housing Leisure Time Physical Health Social Support Transportation Vocational/Educational  ADL's:  Intact  Cognition: WNL  Sleep:  Fair   Screenings:   Assessment and Plan: Scott Soto "Scott Soto" is a 19 year old biracial male with medical history significant of Crohn's disease and psychiatric history significant of depression and anxiety. He was seen in 2019 for depression and anxiety but never followed up. He is now re-referred by PCP for psychiatric evaluation and med management for depression and anxiety.  Never gave a full trial to either Zoloft or Lexapro.   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 He endorses symptoms of anxiety and depression which seems to have precipitated after the diagnosis of Crohn's disease and  related psychosocial adjustment. Symptoms appears to have perpetuated and worsened in the context maladaptive coping, his lack of  following up with recommended psychiatric treatment and intermittent IBD associated pain.  It appears that symptoms have progressively worsened since his last visit in 2019 and particularly since past 6-7 months in the context of his ongoing GI problems, social isolation due to [place]. Started on Prozac, appears to be tolerating well without improvement. Continues to have intermittent GI symptoms and following up with [facility]. Discussed with mother that patient continues to remain severely depressed and anxious, denies any thoughts of suicide or self-harm at present, refuses intensive outpatient program, and discussed chronically elevated risk of self-harm/suicide given his risk factors of chronic medical conditions, chronic pain, depression, help rejecting attitude and recommended to continue follow up with safety precautions such as locking up all the medications, sharps and knives, fire arms, provide increased supervision and call 911 or come to ER for any acute safety concerns.  Plan  # Depression(recurrent, severe).  - Increase Prozac 40 mg daily.  - Start Ind therapy, referred back to his previous therapist at [facility], Ms. Cecilie Lowers, had an appointment on 04/06, however she has left the practice and pt does not have replacement of therapist. Mother and pt were informed. Recommended mother to look into [facility] for therapy. Also discussed that once a new therapist at this clinic starts, writer will refer pt to her. PT was recommended IOP but he refuses and declined a referral.  - Recommended mother to get pt involved in support group for crohn's diseases, discussed resources.  - Writer discussed treatment recommendation.  - Labs reviewed as mentioned above in HPI.    # Anxiety (chronic, worse) - Same as above.  - Continue Atarax 25 mg TID PRN for anxiety  # Sleeping difficulties - Continue Trazodone 50 mg QHS.   # Safety A suicide and violence risk assessment was performed as part of this evaluation. The  patient is deemed to be at chronic elevated risk for self-harm/suicide given the following factors: current diagnosis of MDD and Anxiety Disorders, Chronic medical condition, Chronic pain, help rejecting attitude.  These risk factors are mitigated by the following factors: lack of active SI/HI, no know access to weapons or firearms, no history of previous suicide attempts , no history of violence, motivation for treatment, supportive family, presence of an available support system, employment or functioning in a structured work/academic setting, safe housing and support system in agreement with treatment recommendations. There is no acute risk for suicide or violence at this time. The patient was educated about relevant modifiable risk factors including following recommendations for treatment of psychiatric illness and abstaining from substance abuse. While future psychiatric events cannot be accurately predicted, the patient does not request acute inpatient psychiatric care and does not currently meet [facility] involuntary commitment criteria.     Follow up in 2 weeks or early if symptoms worsens/fail to improve.   Total time spent of date of service was 45 minutes.  Patient care activities included preparing to see the patient such as reviewing the patient's record, obtaining and/or living separately obtain history, performing a medically appropriate history and mental status examination, counseling and educating the patient, family, and over the caregiver, ordering prescription medications, referring and communicating with other healthcare providers when not separately reported during the visit, documenting clinical information  in the electronic for other health record, communicating results to the patient/family/caregiver and coordinating the care of the patient when not separately reported.     Orlene Erm, MD 08/26/2019, 5:15 PM

## 2019-08-27 ENCOUNTER — Telehealth: Payer: Self-pay | Admitting: Child and Adolescent Psychiatry

## 2019-08-27 NOTE — Telephone Encounter (Signed)
Called patient's [facility] to speak with Dr. Cephas Darby.  Her front desk took the message and reported that she will have Dr. Donnel Saxon nurse return the call.

## 2019-08-30 NOTE — Telephone Encounter (Addendum)
Received a call this morning from pt's [facility] Dr. Cephas Darby. She reports that she saw Paz last week and he appears to have abnormal Fecal Calproectin and she is planning to increase his Humira and recommened repeat UGI endoscopy and Colonoscopy along with MRI of abdomen to rule out flaring of Crohn's. She reports that during the last appointment pt had expressed to her regarding financial stressors at home, difficulties with relationship with his girlfriend and was tearful. She reports that sometimes it is difficult to tease out whether the pain is from IBD vs IBS. We discussed about pt's complain of pain during his last appointment with this writer and its putting limitation in his functioning. Dr. Cephas Darby reports referring to pain management at [facility]. We discussed to stay in contact as needed. Number [phone]

## 2019-09-09 ENCOUNTER — Telehealth: Payer: Self-pay | Admitting: Child and Adolescent Psychiatry

## 2019-09-10 ENCOUNTER — Telehealth: Payer: Medicaid Other | Admitting: Child and Adolescent Psychiatry

## 2019-09-10 NOTE — Telephone Encounter (Signed)
Ok thanks for letting me know.

## 2019-09-16 ENCOUNTER — Other Ambulatory Visit: Payer: Self-pay | Admitting: Child and Adolescent Psychiatry

## 2019-09-16 DIAGNOSIS — F332 Major depressive disorder, recurrent severe without psychotic features: Secondary | ICD-10-CM

## 2019-09-16 DIAGNOSIS — F418 Other specified anxiety disorders: Secondary | ICD-10-CM

## 2019-10-15 ENCOUNTER — Telehealth: Payer: Self-pay | Admitting: Child and Adolescent Psychiatry

## 2019-10-15 NOTE — Telephone Encounter (Signed)
Made an outreach since pt has not followed up with this provider for sometime and cancelled last appointment. Recommended to call us to make an appointment for follow up.

## 2019-12-13 ENCOUNTER — Other Ambulatory Visit: Payer: Self-pay

## 2019-12-13 ENCOUNTER — Telehealth: Payer: Medicaid Other | Admitting: Child and Adolescent Psychiatry

## 2019-12-13 ENCOUNTER — Telehealth: Payer: Self-pay

## 2019-12-13 ENCOUNTER — Telehealth: Payer: Self-pay | Admitting: Child and Adolescent Psychiatry

## 2019-12-13 NOTE — Telephone Encounter (Signed)
pt mother called states that he was ok but his stomach is hurting and he not eatting because of his stomach

## 2019-12-13 NOTE — Telephone Encounter (Signed)
Ok thanks 

## 2019-12-13 NOTE — Telephone Encounter (Signed)
Writer sent a link to connect on the video for scheduled appointment. He did not connect. Writer followed up with phone call to his mother as that is the only number listed on the chart and he lives with his mother. His mother reports that he is not feeling well and does not want to connect today for the appointment and asked to reschedule. She also reports that he has been staying in his room, more so since last three days and initially reported that she is not sure if he is eating. She reports that her back is hurting more recently so she has not been making meals. Writer expressed concerns regarding pt staying in room and not eating and recommended that patient can be brought to ER and can get evaluated. Mother spoke with patient and he subsequently reported that he is eating, he coming out of his room and even took the trash out. Mother reports that her back is hurting and may have not paid attention to this. She was recommended to provide increased supervision, and if she has any acute safety concerns that she is to bring patient to ER or call 911. She rescheduled this appointment in 10 days and was recommended in person appointment. M verbalized understanding.

## 2019-12-23 ENCOUNTER — Telehealth: Payer: Medicaid Other | Admitting: Child and Adolescent Psychiatry

## 2019-12-23 ENCOUNTER — Other Ambulatory Visit: Payer: Self-pay

## 2019-12-27 ENCOUNTER — Telehealth: Payer: Self-pay | Admitting: Child and Adolescent Psychiatry

## 2019-12-27 ENCOUNTER — Telehealth: Payer: Medicaid Other | Admitting: Child and Adolescent Psychiatry

## 2019-12-27 ENCOUNTER — Other Ambulatory Visit: Payer: Self-pay

## 2019-12-27 NOTE — Telephone Encounter (Signed)
Pt's mother was sent link via text twice and email to connect on video for telemedicine encounter for scheduled appointment, and was also followed up with phone calls twice. Pt did not connect on the video, and writer left the VM requesting to connect on the video or call back to reschedule appointment if they are not able to connect today for appointment. Also left VM to return call if they would like to continue the treatment at this clinic since they have not followed up since 04/29, has either cancelled their scheduled appointments or no showed for the scheduled appointments since then.

## 2020-03-22 ENCOUNTER — Ambulatory Visit
Admission: EM | Admit: 2020-03-22 | Discharge: 2020-03-22 | Disposition: A | Discharge status: Home / Self Care | Payer: Medicaid Other | Attending: Emergency Medicine | Admitting: Emergency Medicine

## 2020-03-22 ENCOUNTER — Other Ambulatory Visit: Payer: Self-pay

## 2020-03-22 DIAGNOSIS — J020 Streptococcal pharyngitis: Secondary | ICD-10-CM | POA: Diagnosis not present

## 2020-03-22 LAB — GROUP A STREP BY PCR: Group A Strep by PCR: DETECTED — AB

## 2020-03-22 MED ORDER — AZITHROMYCIN 500 MG PO TABS: 500.0000 mg | ORAL_TABLET | Freq: Every day | ORAL | 0 refills | Status: DC

## 2020-03-22 MED ORDER — AZITHROMYCIN 500 MG PO TABS: 500.0000 mg | ORAL_TABLET | Freq: Every day | ORAL | 0 refills | Status: AC

## 2020-03-22 NOTE — ED Triage Notes (Signed)
Patient presents to [facility] with mother. States that patient is uncomfortable speaking. Patient states that this started this morning. Reports that he has been unable to speak and swallow secondary to pain.

## 2020-03-22 NOTE — ED Provider Notes (Signed)
HPI  SUBJECTIVE:  Patient reports sore throat starting this morning. Sx worse with swallowing, lying down.  No alleviating factors.  Has not tried anything for this. No fever  No neck stiffness  + Occasional cough +nasal congestion, rhinorrhea, postnasal drip No Myalgias No Headache No Rash  No loss of taste or smell No shortness of breath or difficulty breathing No nausea, vomiting No diarrhea No abdominal pain     No Recent Strep, mono, COVID exposure No reflux sxs No Allergy sxs  No Breathing difficulty,sensation of throat swelling shut + Raspy, but not muffled voice No Drooling No Trismus No abx in past month.  Patient has a past medical history of Crohn's on Humira and prednisone, IBS and sepsis.  No history of frequent strep, mono, diabetes.   KGY:JEHUD, Jonna Munro, PA-C    History reviewed. No pertinent past medical history.  Past Surgical History:  Procedure Laterality Date  . FOOT SURGERY Left     Family History  Problem Relation Age of Onset  . Depression Mother   . Anxiety disorder Mother   . Anxiety disorder Sister   . Depression Sister     Social History   Tobacco Use  . Smoking status: Never Smoker  . Smokeless tobacco: Never Used  Vaping Use  . Vaping Use: Never used  Substance Use Topics  . Alcohol use: Never  . Drug use: Yes    Types: Marijuana    No current facility-administered medications for this encounter.  Current Outpatient Medications:  .  Adalimumab 40 MG/0.4ML PNKT, Inject into the skin., Disp: , Rfl:  .  cetirizine (ZYRTEC) 10 MG tablet, Take by mouth., Disp: , Rfl:  .  cyanocobalamin 1000 MCG tablet, Take by mouth., Disp: , Rfl:  .  FLUoxetine (PROZAC) 40 MG capsule, TAKE 1 CAPSULE BY MOUTH EVERY DAY, Disp: 30 capsule, Rfl: 0 .  Nutritional Supplements (ENSURE NUTRITION SHAKE) LIQD, Take by mouth., Disp: , Rfl:  .  omeprazole (PRILOSEC) 20 MG capsule, Take by mouth., Disp: , Rfl:  .  predniSONE (DELTASONE) 10 MG  tablet, Take 20 mg by mouth daily., Disp: , Rfl: 1 .  predniSONE (DELTASONE) 5 MG tablet, TAKE 1 AND 1/2 TABLETS BY MOUTH ONCE DAILY, Disp: , Rfl:  .  PROAIR HFA 108 (90 Base) MCG/ACT inhaler, TAKE 2 PUFFS BY MOUTH EVERY 4 HOURS AS NEEDED, Disp: , Rfl: 9 .  tretinoin (RETIN-A) 0.01 % gel, Apply topically., Disp: , Rfl:  .  azithromycin (ZITHROMAX) 500 MG tablet, Take 1 tablet (500 mg total) by mouth daily for 5 days., Disp: 5 tablet, Rfl: 0 .  hydrOXYzine (ATARAX/VISTARIL) 25 MG tablet, Take 1 tablet (25 mg total) by mouth 3 (three) times daily as needed for anxiety., Disp: 30 tablet, Rfl: 0 .  hyoscyamine (LEVSIN SL) 0.125 MG SL tablet, Place under the tongue., Disp: , Rfl:  .  pantoprazole (PROTONIX) 40 MG tablet, Take by mouth., Disp: , Rfl:  .  traZODone (DESYREL) 50 MG tablet, Take 1 tablet (50 mg total) by mouth at bedtime., Disp: 30 tablet, Rfl: 0  Allergies  Allergen Reactions  . Amoxicillin Hives    Fever, redness and burning sensation throughout body.  Last episode pt went to ICU.  Fever, redness and burning sensation throughout body.  Last episode pt went to ICU.    Marland Kitchen Sulfa Antibiotics Hives    Fever, redness and burning sensation throughout body.  Last episode pt went to ICU.  Fever, redness and burning sensation  throughout body.  Last episode pt went to ICU.    . Tape Rash    Silk and plastic tape makes his  skin comes peel off. Silk and plastic tape makes his  skin comes peel off.      ROS  As noted in HPI.   Physical Exam  BP 125/74 (BP Location: Right Arm)   Pulse 72   Temp 98.6 F (37 C) (Oral)   Resp 18   Wt 61.6 kg   SpO2 99%   Constitutional: Well developed, well nourished, no acute distress Eyes:  EOMI, conjunctiva normal bilaterally HENT: Normocephalic, atraumatic,mucus membranes moist. - nasal congestion + erythematous oropharynx - enlarged tonsils - exudates. Uvula midline.  Positive petechiae on palate Respiratory: Normal inspiratory  effort Cardiovascular: Normal rate, no murmurs, rubs, gallops GI: nondistended, nontender. No appreciable splenomegaly skin: No rash, skin intact Lymph: + Anterior cervical LN.  + posterior cervical lymphadenopathy Musculoskeletal: no deformities Neurologic: Alert & oriented x 3, no focal neuro deficits Psychiatric: Speech and behavior appropriate.  ED Course   Medications - No data to display  Orders Placed This Encounter  Procedures  . Group A Strep by PCR    Standing Status:   Standing    Number of Occurrences:   1    Results for orders placed or performed during the hospital encounter of 03/22/20 (from the past 24 hour(s))  Group A Strep by PCR     Status: Abnormal   Collection Time: 03/22/20  3:44 PM   Specimen: Throat; Sterile Swab  Result Value Ref Range   Group A Strep by PCR DETECTED (A) NOT DETECTED   No results found.  ED Clinical Impression  1. Strep pharyngitis      ED Assessment/Plan   strep PCR positive.  Sending home with azithromycin 500 mg for 5 days as patient ended up in the ICU after taking amoxicillin.  Do not feel comfortable sending home with cephalosporin.  Home with Tylenol, Benadryl/Maalox mixture. Patient to followup with PMD when necessary.  To the ER if he gets worse  Discussed labs,  MDM, plan and followup with parent. Discussed sn/sx that should prompt return to the ED. parent agrees with plan.   Meds ordered this encounter  Medications  . DISCONTD: azithromycin (ZITHROMAX) 500 MG tablet    Sig: Take 1 tablet (500 mg total) by mouth daily for 5 days.    Dispense:  5 tablet    Refill:  0  . azithromycin (ZITHROMAX) 500 MG tablet    Sig: Take 1 tablet (500 mg total) by mouth daily for 5 days.    Dispense:  5 tablet    Refill:  0     *This clinic note was created using [employer]. Therefore, there may be occasional mistakes despite careful proofreading.     Melynda Ripple, MD 03/22/20 2122

## 2020-03-22 NOTE — Discharge Instructions (Addendum)
Your strep PCR was positive.  I am sending you home with azithromycin.  Finish it, even if you feel better.  500 mg- 1000 mg of Tylenol  3-4 times a day as needed for pain.  Make sure you drink plenty of extra fluids.  Some people find salt water gargles and  Tr[employer]'s "Throat Coat" tea helpful. Take 5 mL of liquid Benadryl and 5 mL of Maalox. Mix it together, and then hold it in your mouth for as long as you can and then swallow. You may do this 4 times a day.    Go to www.goodrx.com to look up your medications. This will give you a list of where you can find your prescriptions at the most affordable prices. Or ask the pharmacist what the cash price is, or if they have any other discount programs available to help make your medication more affordable. This can be less expensive than what you would pay with insurance.

## 2020-07-28 ENCOUNTER — Other Ambulatory Visit: Payer: Self-pay | Admitting: Child and Adolescent Psychiatry

## 2020-07-28 DIAGNOSIS — F418 Other specified anxiety disorders: Secondary | ICD-10-CM

## 2020-07-28 DIAGNOSIS — F332 Major depressive disorder, recurrent severe without psychotic features: Secondary | ICD-10-CM

## 2020-08-23 ENCOUNTER — Telehealth: Payer: Self-pay

## 2020-08-23 NOTE — Telephone Encounter (Signed)
Thanks for the message. I cannot write letter regarding his current diagnosis since I have not seen him for more than a year. I would advise them to request medical records for his previous diagnosis and treatment at this clinic which they can provide to social security office. Please let the mother know.

## 2020-08-23 NOTE — Telephone Encounter (Signed)
pt mother called she asked if you would write a letter that explanis Scott Soto dx- states he has a disablitiy case and he needs a letter to do his appeal for disability.   Pt mother was explained that her son was no long a patient in our office. She was told that he had not been seen in over a year.  I told her that Dr.Umrania would possible not write a letter because it has been over a year since his last visit. But she insisted that I send a message.

## 2020-08-23 NOTE — Telephone Encounter (Signed)
Pt mother was notified that letter could not be written because pt has not been seen in a year and that she was advised that she could come by the office to sign a release to get the medical records but our office could not supply a letter.

## 2020-12-26 ENCOUNTER — Ambulatory Visit
Admission: RE | Admit: 2020-12-26 | Discharge: 2020-12-26 | Disposition: A | Discharge status: Home / Self Care | Payer: Medicaid Other | Source: Ambulatory Visit | Attending: Pediatrics | Admitting: Pediatrics

## 2020-12-26 ENCOUNTER — Other Ambulatory Visit: Payer: Self-pay

## 2020-12-26 ENCOUNTER — Ambulatory Visit
Admission: RE | Admit: 2020-12-26 | Discharge: 2020-12-26 | Disposition: A | Discharge status: Home / Self Care | Payer: Medicaid Other | Attending: Pediatrics | Admitting: Pediatrics

## 2020-12-26 ENCOUNTER — Other Ambulatory Visit: Payer: Self-pay | Admitting: Pediatrics

## 2020-12-26 DIAGNOSIS — M549 Dorsalgia, unspecified: Secondary | ICD-10-CM | POA: Insufficient documentation

## 2020-12-26 DIAGNOSIS — K50918 Crohn's disease, unspecified, with other complication: Secondary | ICD-10-CM

## 2021-01-11 ENCOUNTER — Other Ambulatory Visit: Payer: Self-pay

## 2021-01-11 ENCOUNTER — Ambulatory Visit
Admission: RE | Admit: 2021-01-11 | Discharge: 2021-01-11 | Disposition: A | Discharge status: Home / Self Care | Payer: Medicaid Other | Source: Ambulatory Visit | Attending: Pediatrics | Admitting: Pediatrics

## 2021-01-11 DIAGNOSIS — K50918 Crohn's disease, unspecified, with other complication: Secondary | ICD-10-CM | POA: Diagnosis present

## 2021-01-11 DIAGNOSIS — M549 Dorsalgia, unspecified: Secondary | ICD-10-CM | POA: Diagnosis present

## 2021-02-13 ENCOUNTER — Ambulatory Visit: Payer: Medicaid Other | Admitting: Physical Therapy

## 2021-02-15 ENCOUNTER — Ambulatory Visit: Payer: Medicaid Other | Attending: Pediatrics | Admitting: Physical Therapy

## 2021-02-15 ENCOUNTER — Other Ambulatory Visit: Payer: Self-pay

## 2021-02-15 ENCOUNTER — Encounter: Payer: Self-pay | Admitting: Physical Therapy

## 2021-02-15 DIAGNOSIS — G8929 Other chronic pain: Secondary | ICD-10-CM | POA: Diagnosis present

## 2021-02-15 DIAGNOSIS — M545 Low back pain, unspecified: Secondary | ICD-10-CM | POA: Diagnosis not present

## 2021-02-15 DIAGNOSIS — R2689 Other abnormalities of gait and mobility: Secondary | ICD-10-CM | POA: Insufficient documentation

## 2021-02-15 DIAGNOSIS — R29898 Other symptoms and signs involving the musculoskeletal system: Secondary | ICD-10-CM | POA: Diagnosis present

## 2021-02-15 DIAGNOSIS — R279 Unspecified lack of coordination: Secondary | ICD-10-CM | POA: Insufficient documentation

## 2021-02-15 NOTE — Therapy (Signed)
[facility] [address] [place], [place], [address] Phone: [phone]   Fax:  [phone]  Physical Therapy Treatment and Initial Evaluation 02/15/2021  Patient Details  Name: Scott Soto MRN: [id number] Date of Birth: [birth year] Referring Provider (PT): Gillermina Hu MD  Encounter Date: 02/15/2021   PT End of Session - 02/15/21 1648     Visit Number 1    Number of Visits 16    Date for PT Re-Evaluation 04/12/21    Authorization Type Medicaid    Authorization - Visit Number 1    Authorization - Number of Visits 10    Progress Note Due on Visit 10    PT Start Time 8756    PT Stop Time 1602    PT Time Calculation (min) 46 min    Activity Tolerance Patient tolerated treatment well;Patient limited by fatigue;No increased pain    Behavior During Therapy [facility] for tasks assessed/performed;Flat affect             History reviewed. No pertinent past medical history.  Past Surgical History:  Procedure Laterality Date   FOOT SURGERY Left     There were no vitals filed for this visit.   Subjective Assessment - 02/15/21 1642     Subjective Pt presents to PT with c/c of bilat LE weakness. Pt states poor functional strength and mobility with bilat LE leading to poor walking and standing tolerance. Pt states that the back pain and bilat LE weakness tend to happen together. Pt states increased back pain during static standing, sitting, and supine postures. Pt that's that this a chronic functional deficits. Pt states that weakness and pain as gotten worse over the last 6 months 2/2 to more activity.    Pertinent History Pt states ~2 years of chronic back pain with insidious onset. Pt as a long history of Crohns and IBS. pt is currently 100% intake by mount. Pt states poor appetite and is currently not seeing an specialist for nutrient intake.    Limitations Sitting;Walking;Lifting;Standing    How long can you sit comfortably? 30 mins -  1 HR    How long can you stand comfortably? 15-30 mins    How long can you walk comfortably? 30 mins.    Diagnostic tests X:ray 12/28/2020: AP views of the thoracic and lumbar spine demonstrate no scoliosis.  Disc spaces maintained. No focal skeletal abnormality. Bone density scan 01/12/2021: The BMD measured at AP Spine L1-L4 is 1.034 g/cm2 with a Z-score of  -1.5. The Z-score is within the expected range for age.    Patient Stated Goals To be able to walk far without a problem and play basketball.    Currently in Pain? Yes    Pain Score 3     Pain Location Back    Pain Orientation Lower;Right;Left;Mid    Pain Descriptors / Indicators Aching    Pain Type Chronic pain    Pain Radiating Towards n/a    Pain Onset More than a month ago    Pain Frequency Intermittent    Aggravating Factors  Walking, playing basketball, and running.    Pain Relieving Factors Lay down or sitting  down.    Effect of Pain on Daily Activities Poor standing and walking tolerance    Multiple Pain Sites No                [facility] PT Assessment - 02/15/21 0001       Assessment   Medical Diagnosis  Dorsalgia, unspecified    Referring Provider (PT) Gillermina Hu MD    Onset Date/Surgical Date 04/29/18    Hand Dominance Right    Next MD Visit PCP annual check up 02/2021    Prior Therapy Yes previous PT at this clinic for similar functional deficits.      Precautions   Precautions None      Restrictions   Weight Bearing Restrictions No      Balance Screen   Has the patient fallen in the past 6 months No      Prior Function   Level of Independence Independent      Cognition   Overall Cognitive Status Within Functional Limits for tasks assessed           SUBJECTIVE Chief complaint:  Pt presents to PT with c/c of bilat LE weakness. Pt states poor functional strength and mobility with bilat LE leading to poor walking and standing tolerance. Pt states that the back pain and bilat LE weakness tend to happen  together. Pt states increased back pain during static standing, sitting, and supine postures. Pt that's that this a chronic functional deficits. Pt states that weakness and pain as gotten worse over the last 6 months 2/2 to more activity.  History: Pt states ~2 years of chronic back pain with insidious onset. Pt as a long history of Crohns and IBS. pt is currently 100% intake by mount. Pt states poor appetite and is currently not seeing an specialist for nutrient intake.  Referring Dx: Dorsalgia, unspecified Referring Provider: Gillermina Hu MD  Pain location: Medial LBP Pain: Present 3/10, Best 0/10, Worst 8/10: Pain quality: Ache  Radiating pain: None Numbness/Tingling: None 24 hour pain behavior: Increase LBP in the evenings and morning.  Aggravating factors: Walking, playing basketball, and running.  Easing factors: Lay down or sitting  down.  How long can you sit: 30 mins - 1 HR  How long can you stand: 15-30 mins How long can you walk: 30 mins.  History of back injury, pain, surgery, or therapy:  Follow-up appointment with MD:  Long history of LBP Dominant hand: Right Imaging: X:ray 12/28/2020: AP views of the thoracic and lumbar spine demonstrate no scoliosis. Disc spaces maintained. No focal skeletal abnormality. Bone density scan 01/12/2021: The BMD measured at AP Spine L1-L4 is 1.034 g/cm2 with a Z-score of -1.5. The Z-score is within the expected range for age. Falls in the last 6 months: None  Occupational demands: None  Hobbies: Basketball, cars, and going [place].  Goals: To be able to walk far without a problem and play basketball.  Red flags (bowel/bladder changes, saddle paresthesia, personal history of cancer, chills/fever, night sweats, unrelenting pain, first onset of insidious LBP <20 y/o) Negative   PT states that he has occ night sweats and plans to inform his PCP during a annual check up in November     OBJECTIVE  FOTO: 45 with a predicted improvement value  of 58.   Mental Status Patient is oriented to person, place and time.  Recent memory is intact.  Remote memory is intact.  Attention span and concentration are intact.  Expressive speech is intact.  Patient's fund of knowledge is within normal limits for educational level.  SENSATION: Grossly intact to light touch bilateral LEs as determined by testing dermatomes L2-S2 Proprioception and hot/cold testing deferred on this date   MUSCULOSKELETAL: Tremor: None Bulk: Normal Tone: Normal No visible step-off along spinal column  Posture Lumbar lordosis:  WNL Iliac crest height: equal bilaterally Lumbar lateral shift: negative Lower crossed syndrome (tight hip flexors and erector spinae; weak gluts and abs): negative  Gait  Pt displayed abnormally slow gait with no lumbar or thoracic rotation during swing phase. Pt displayed hypermobility in the pelvis with increase ant and post rotation during swing phase. Pt displayed bilat decrease stride length.    Palpation - Gross palpation displayed proper alignment of lumbar thoracic spine in relationship to the pelvic. Further skeletomuscular palpation is warranted during a future visits.    Strength (out of 5) R/L 4+/4+ Hip flexion 5/5 Hip ER 5/5 Hip IR  5/5 Hip abduction 4/4 Hip adduction 5/5 Knee extension 5/5 Knee flexion 5/5 Ankle dorsiflexion 5/5 Ankle plantarflexion    *Indicates pain   AROM (degrees) R/L (all movements include overpressure unless otherwise stated) All thoracic, lumbar, and bilat LE AROM were WNL and pain free with OP besides Lumbar ext, and lateral rotation.      Repeated Movements Pt stated no change in LBP with flexion x10 and increased LBP with x10 extension    Muscle Length Hamstrings: R: WNL  degrees L: WNL  Thomas test: Deferred    Passive Accessory Intervertebral Motion (PAIVM) Deferred 2/2 to AROM WNL     SPECIAL TESTS Lumbar Radiculopathy and Discogenic: Centralization and  Peripheralization (SN 92, -LR 0.12): neg bilat  Slump (SN 83, -LR 0.32):  neg bilat SLR (SN 92, -LR 0.29): neg bilat  Crossed SLR (SP 90): neg bilat   Facet Joint: Extension-Rotation (SN 100, -LR 0.0): R: +  L: -    Hip: FABER (SN 81): neg bilat   FADIR (SN 94):  neg bilat   Hip scour (SN 50): neg bilat     Piriformis Syndrome: FAIR Test (SN 88, SP 83): neg bilat  Functional Tasks Lifting: deferred   Squatting: Poor mechanics with pain at the bottom of the squat. Pt displayed narrow BOS with valgus of bilat knees R>L with with bilat heel lift. Pt was able to display correct squat form with pain with extensive verbal and contact cueing.   Sit to stand: WNL   Forward Step-Down Test: Increase knee pain in the tested leg during the movement. Poor mechanics with bilat knee valgus and LBP.      Updated HEP  Access Code: [email or web address] URL: [email or web address] Date: 02/15/2021 Prepared by: Dorcas Carrow  Exercises  Supine Posterior Pelvic Tilt - 1 x daily - 7 x weekly - 3 sets - 10 reps - 10 sec hold Supine March with Posterior Pelvic Tilt - 1 x daily - 7 x weekly - 3 sets - 10 reps Supine Bridge - 1 x daily - 7 x weekly - 3 sets - 10 reps   ASSESSMENT Clinical Impression: Pt is a pleasant 20 year-old male with min-mod flat affect  referred for: Dorsalgia, unspecified. PT examination reveals the following deficits: Functional task: Squatting: Poor mechanics with pain at the bottom of the squat. Pt displayed narrow BOS with valgus of bilat knees R>L with with bilat heel lift. Pt was able to display correct squat form with pain with extensive verbal and contact cueing. Forward Step-Down Test: Increase knee pain in the tested leg during the movement. Poor mechanics with bilat knee valgus and LBP. NPS: Present 3/10, Best 0/10, Worst 8/10. FOTO: 45 with predicted improvement value of 58.  Gait: Pt displayed abnormally slow gait with no lumbar or thoracic rotation during  swing phase. Pt displayed hypermobility in the pelvis with increase  ant and post rotation during swing phase. Pt displayed bilat decrease stride length. LE MMT: 4+/4+ Hip flexion. 5/5 Hip ER. 5/5 Hip IR. 5/5 Hip abduction. 4/4 Hip adduction. 5/5 Knee extension. 5/5 Knee flexion. 5/5 Ankle dorsiflexion. 5/5 Ankle plantarflexion.  Possible referral is warranted for RD for proper caloric intake. Pt case displays as moderate complexity with good prognosis for optimal return to PLOF due to time from onset, current level of daily physically activity, PMH, and PLOF. Pt will benefit from skilled PT 2x/week for 8 weeks to address deficits and promote safe independence with community and home related mobility and ADLs.            PT Education - 02/15/21 1646     Education Details Pt was educated on proper form and techinque with HEP with proper TrA firing    Person(s) Educated Patient    Methods Explanation;Demonstration;Tactile cues;Verbal cues;Handout    Comprehension Verbalized understanding;Returned demonstration                 PT Long Term Goals - 02/15/21 1659       PT LONG TERM GOAL #1   Title Pt will achieve the predicted improvement FOTO score of 58 to measure self-reported increased access to ADLs    Baseline 45    Time 8    Period Weeks    Status New    Target Date 04/12/21      PT LONG TERM GOAL #2   Title PT will display proper squat mechancis during x20 squats with pain 3/10 or less, to allow greater access to sport related activity.    Baseline Squatting: Poor mechanics with pain at the bottom of the squat. Pt displayed narrow BOS with valgus of bilat knees R>L with with bilat heel lift. Pt was able to display correct squat form with pain with extensive verbal and contact cueing.    Time 8    Period Weeks    Status New    Target Date 04/12/21      PT LONG TERM GOAL #3   Title Pt will be independent with HEP in order to decrease worse back pain by atleast 2/10NPS  in order to improve pain-reduced function at home and work.    Baseline NPS: Present 3/10, Best 0/10, Worst 8/10.    Time 8    Period Weeks    Status New    Target Date 04/12/21      PT LONG TERM GOAL #4   Title Pt will displayed a 6MWT distance of atleast 1945f  to allow greater standing and walking tolerance at a proper workload for the bilat LE.    Baseline Deferred to lateral tx. Current gait assessment: Pt displayed abnormally slow gait with no lumbar or thoracic rotation during swing phase. Pt displayed hypermobility in the pelvis with increase ant and post rotation during swing phase. Pt displayed bilat decrease stride length.    Time 8    Period Weeks    Status New    Target Date 04/12/21      PT LONG TERM GOAL #5   Title Pt will increase strength of by at least 1/2 MMT grade in order to demonstrate improvement in strength and function.    Baseline LE MMT: 4+/4+ Hip flexion. 5/5 Hip ER. 5/5 Hip IR. 5/5 Hip abduction. 4/4 Hip adduction. 5/5 Knee extension. 5/5 Knee flexion. 5/5 Ankle dorsiflexion.  5/5 Ankle plantarflexion    Time 8    Period Weeks  Status New    Target Date 04/12/21                   Plan - 02/15/21 1649     Clinical Impression Statement Clinical Impression: Pt is a pleasant 20 year-old male with min-mod flat affect  referred for: Dorsalgia, unspecified. PT examination reveals the following deficits: Functional task: Squatting: Poor mechanics with pain at the bottom of the squat. Pt displayed narrow BOS with valgus of bilat knees R>L with with bilat heel lift. Pt was able to display correct squat form with pain with extensive verbal and contact cueing. Forward Step-Down Test: Increase knee pain in the tested leg during the movement. Poor mechanics with bilat knee valgus and LBP. NPS: Present 3/10, Best 0/10, Worst 8/10. FOTO: 45 with predicted improvement value of 58.   Gait: Pt displayed abnormally slow gait with no lumbar or thoracic rotation during  swing phase. Pt displayed hypermobility in the pelvis with increase ant and post rotation during swing phase. Pt displayed bilat decrease stride length. LE MMT: 4+/4+ Hip flexion. 5/5 Hip ER. 5/5 Hip IR. 5/5 Hip abduction. 4/4 Hip adduction. 5/5 Knee extension. 5/5 Knee flexion. 5/5 Ankle dorsiflexion.  5/5 Ankle plantarflexion  Possible referral is warranted for RD for proper caloric intake. Pt case displays as moderate complexity with good prognosis for optimal return to PLOF due to time from onset, current level of daily physically activity, PMH, and PLOF. Pt will benefit from skilled PT 2x/week for 8 weeks to address deficits and promote safe independence with community and home related mobility and ADLs.    Personal Factors and Comorbidities Comorbidity 2;Past/Current Experience;Time since onset of injury/illness/exacerbation;Social Background;Behavior Pattern;Age    Comorbidities See PMH    Examination-Activity Limitations Stairs;Stand;Sit;Sleep;Locomotion Level;Squat    Examination-Participation Restrictions Interpersonal Relationship;Cleaning    Stability/Clinical Decision Making Evolving/Moderate complexity    Clinical Decision Making Moderate    Rehab Potential Good    Clinical Impairments Affecting Rehab Potential Crohn's disease, major depressive disorder, drug use    PT Frequency 2x / week    PT Duration 8 weeks    PT Treatment/Interventions Moist Heat;Therapeutic exercise;Neuromuscular re-education;Patient/family education;Passive range of motion;Spinal Manipulations;Joint Manipulations;Manual techniques;Therapeutic activities;ADLs/Self Care Home Management;Dry needling;Biofeedback;Electrical Stimulation;Ultrasound;Traction;Gait training;Stair training;Functional mobility training;Balance training;Energy conservation    PT Next Visit Plan 6MWT, Lumbar muscle paplation    PT Home Exercise Plan [email or web address]    Recommended Other Services Possible RD referral to ensure proper calcoric intake     Consulted and Agree with Plan of Care Patient             Patient will benefit from skilled therapeutic intervention in order to improve the following deficits and impairments:  Abnormal gait, Decreased balance, Decreased activity tolerance, Decreased coordination, Decreased endurance, Decreased mobility, Decreased safety awareness, Decreased range of motion, Decreased strength, Difficulty walking, Hypomobility, Impaired flexibility, Impaired perceived functional ability, Postural dysfunction, Pain, Improper body mechanics  Visit Diagnosis: Chronic bilateral low back pain without sciatica  Other abnormalities of gait and mobility  Unspecified lack of coordination  Bilateral leg weakness     Problem List Patient Active Problem List   Diagnosis Date Noted   Major depressive disorder 01/16/2018   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   Crohn's disease of both small and large intestine without complication ([place]) 81/82/9937   Protein calorie malnutrition ([place]) 03/28/2017   Diarrhea 03/08/2017   Crohn disease ([place]) 03/03/2017   Elevated LFTs 12/28/2015   Rash 12/28/2015   Septic shock ([place]) 12/17/2015  Abdominal pain 12/03/2015   Headache 05/08/2015    Fara Olden, Student-PT 02/15/2021, 5:10 PM  Patrina Levering PT, DPT   [facility] [address] [place], [place], [address] Phone: [phone]   Fax:  [phone]  Name: Scott Soto MRN: [id number] Date of Birth: 05-Jun-2000

## 2021-02-15 NOTE — Patient Instructions (Signed)
Access Code: [email or web address] URL: [email or web address] Date: 02/15/2021 Prepared by: Dorcas Carrow  Exercises  Supine Posterior Pelvic Tilt - 1 x daily - 7 x weekly - 3 sets - 10 reps - 10 sec hold Supine March with Posterior Pelvic Tilt - 1 x daily - 7 x weekly - 3 sets - 10 reps Supine Bridge - 1 x daily - 7 x weekly - 3 sets - 10 reps

## 2021-02-15 NOTE — Addendum Note (Signed)
Addended by: Ramonita Lab on: 02/15/2021 06:00 PM   Modules accepted: Orders

## 2021-02-20 ENCOUNTER — Other Ambulatory Visit: Payer: Self-pay

## 2021-02-20 ENCOUNTER — Ambulatory Visit: Payer: Medicaid Other

## 2021-02-20 ENCOUNTER — Encounter: Payer: Self-pay | Admitting: Physical Therapy

## 2021-02-20 DIAGNOSIS — M545 Low back pain, unspecified: Secondary | ICD-10-CM

## 2021-02-20 DIAGNOSIS — R279 Unspecified lack of coordination: Secondary | ICD-10-CM

## 2021-02-20 DIAGNOSIS — R2689 Other abnormalities of gait and mobility: Secondary | ICD-10-CM

## 2021-02-20 DIAGNOSIS — R29898 Other symptoms and signs involving the musculoskeletal system: Secondary | ICD-10-CM

## 2021-02-20 DIAGNOSIS — G8929 Other chronic pain: Secondary | ICD-10-CM

## 2021-02-20 NOTE — Therapy (Signed)
[facility] [address] [place], [place], [address] Phone: [phone]   Fax:  [phone]  Physical Therapy Treatment  Patient Details  Name: Scott Soto MRN: [id number] Date of Birth: [birth year] Referring Provider (PT): Gillermina Hu MD   Encounter Date: 02/20/2021   PT End of Session - 02/20/21 1534     Visit Number 2    Number of Visits 16    Date for PT Re-Evaluation 04/12/21    Authorization Type Medicaid    Authorization - Visit Number 2    Authorization - Number of Visits 10    Progress Note Due on Visit 10    PT Start Time 6962    PT Stop Time 1559    PT Time Calculation (min) 44 min    Activity Tolerance Patient tolerated treatment well;Patient limited by fatigue;No increased pain    Behavior During Therapy [facility] for tasks assessed/performed;Flat affect             History reviewed. No pertinent past medical history.  Past Surgical History:  Procedure Laterality Date   FOOT SURGERY Left     There were no vitals filed for this visit.   Subjective Assessment - 02/20/21 1515     Subjective Pt compliant with HEP. LBP currently at 1/10 NPS. Worst pain up to 8/10 NPS.    Pertinent History Pt states ~2 years of chronic back pain with insidious onset. Pt as a long history of Crohns and IBS. pt is currently 100% intake by mouth. Pt states poor appetite and is currently not seeing a specialist for nutrient intake.    Limitations Sitting;Walking;Lifting;Standing    How long can you sit comfortably? 30 mins - 1 HR    How long can you stand comfortably? 15-30 mins    How long can you walk comfortably? 30 mins.    Diagnostic tests X:ray 12/28/2020: AP views of the thoracic and lumbar spine demonstrate no scoliosis.  Disc spaces maintained. No focal skeletal abnormality. Bone density scan 01/12/2021: The BMD measured at AP Spine L1-L4 is 1.034 g/cm2 with a Z-score of  -1.5. The Z-score is within the expected range for age.     Patient Stated Goals To be able to walk far without a problem and play basketball.    Currently in Pain? Yes    Pain Score 1     Pain Location Back    Pain Onset More than a month ago             There.ex:   6MWT performed: 1,832' with no increase in pain with no rest breaks  Hook lying core activation exercises:    Post tilts, 3x10, with 10 sec holds. Required PT demo and mod VC's initially for form/technique. Good carryover after cuing    Marches maintaining post pelvic tilts: 1x10/LE no resistance, 2x10 with 3# AW's. VC's to decrease speed of exercise   Bridges + post tilt: 3x10, initial VC'ing needed for form/technique   Bridge with isometric adductor hold with ball (rainbow ball) b/t legs alternating knee extension: 3x10, 3# AW's. Cuing needed to reach B terminal knee extension       Monster walks lateral and forwards with GTB around distal femurs: 6x20'/each. Cuing to maintain squat positioning (athletic stance)   Incorporated basketball tosses during lateral walks for dual task/perturbation for core stability. Given GTB for monster walks to add to HEP.    PT Education - 02/20/21 1533     Education Details  form/technique with exercise.    Person(s) Educated Patient    Methods Explanation;Demonstration;Tactile cues;Verbal cues    Comprehension Verbalized understanding;Returned demonstration;Verbal cues required;Tactile cues required              PT Short Term Goals - 01/26/18 1544       PT SHORT TERM GOAL #1   Title Pt will be independent with HEP in order to improve postural endurance and decrease back pain in order to improve pain-free function at home and work.     Baseline Not initiated    Time 2    Period Weeks    Status New    Target Date 02/09/18      PT SHORT TERM GOAL #2   Title Pt will decrease worst back pain as reported on NPRS by at least 2 points in order to demonstrate clinically significant reduction in back pain.     Baseline 7/10 at worst     Time 2    Period Weeks    Status New    Target Date 02/09/18               PT Long Term Goals - 02/20/21 1608       PT LONG TERM GOAL #1   Title Pt will achieve the predicted improvement FOTO score of 58 to measure self-reported increased access to ADLs    Baseline 45    Time 8    Period Weeks    Status New      PT LONG TERM GOAL #2   Title PT will display proper squat mechancis during x20 squats with pain 3/10 or less, to allow greater access to sport related activity.    Baseline Squatting: Poor mechanics with pain at the bottom of the squat. Pt displayed narrow BOS with valgus of bilat knees R>L with with bilat heel lift. Pt was able to display correct squat form with pain with extensive verbal and contact cueing.    Time 8    Period Weeks    Status New      PT LONG TERM GOAL #3   Title Pt will be independent with HEP in order to decrease worse back pain by atleast 2/10NPS in order to improve pain-reduced function at home and work.    Baseline NPS: Present 3/10, Best 0/10, Worst 8/10.    Time 8    Period Weeks    Status New      PT LONG TERM GOAL #4   Title Pt will displayed a 6MWT distance of atleast 1933f  to allow greater standing and walking tolerance at a proper workload for the bilat LE.    Baseline Deferred to later tx. Current gait assessment: Pt displayed abnormally slow gait with no lumbar or thoracic rotation during swing phase. Pt displayed hypermobility in the pelvis with increase ant and post rotation during swing phase. Pt displayed bilat decrease stride length; 10/25: 1832'    Time 8    Period Weeks    Status New    Target Date 04/12/21      PT LONG TERM GOAL #5   Title Pt will increase strength of by at least 1/2 MMT grade in order to demonstrate improvement in strength and function.    Baseline LE MMT: 4+/4+ Hip flexion. 5/5 Hip ER. 5/5 Hip IR. 5/5 Hip abduction. 4/4 Hip adduction. 5/5 Knee extension. 5/5 Knee flexion. 5/5 Ankle dorsiflexion.   5/5 Ankle plantarflexion    Time 8    Period  Weeks    Status New                   Plan - 02/20/21 1604     Clinical Impression Statement Pt performed 6MWT with a distance of 1,832' with no increase in LBP. Close to base line normal limits for age matched norms (1900'). Pt required intermittent, mod cuing for HEP review with good carryover after cuing. Tolerated progression of core activation exercises with no increase in pain with good understanding of technique. Pt enjoys incorporating basketball into LE/core exercises. Will continue to benefit from skilled PT services to improve LBP with functional mobility.    Personal Factors and Comorbidities Comorbidity 2;Past/Current Experience;Time since onset of injury/illness/exacerbation;Social Background;Behavior Pattern;Age    Comorbidities See PMH    Examination-Activity Limitations Stairs;Stand;Sit;Sleep;Locomotion Level;Squat    Examination-Participation Restrictions Interpersonal Relationship;Cleaning    Stability/Clinical Decision Making Evolving/Moderate complexity    Rehab Potential Good    Clinical Impairments Affecting Rehab Potential Crohn's disease, major depressive disorder, drug use    PT Frequency 2x / week    PT Duration 8 weeks    PT Treatment/Interventions Moist Heat;Therapeutic exercise;Neuromuscular re-education;Patient/family education;Passive range of motion;Spinal Manipulations;Joint Manipulations;Manual techniques;Therapeutic activities;ADLs/Self Care Home Management;Dry needling;Biofeedback;Electrical Stimulation;Ultrasound;Traction;Gait training;Stair training;Functional mobility training;Balance training;Energy conservation    PT Next Visit Plan Upright, WB'ing core and LE exercises    PT Home Exercise Plan [email or web address]    Consulted and Agree with Plan of Care Patient             Patient will benefit from skilled therapeutic intervention in order to improve the following deficits and impairments:  Abnormal  gait, Decreased balance, Decreased activity tolerance, Decreased coordination, Decreased endurance, Decreased mobility, Decreased safety awareness, Decreased range of motion, Decreased strength, Difficulty walking, Hypomobility, Impaired flexibility, Impaired perceived functional ability, Postural dysfunction, Pain, Improper body mechanics  Visit Diagnosis: Chronic bilateral low back pain without sciatica  Other abnormalities of gait and mobility  Unspecified lack of coordination  Bilateral leg weakness     Problem List Patient Active Problem List   Diagnosis Date Noted   Major depressive disorder 01/16/2018   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 Fairly IV, PT, DPT Physical Therapist- [facility]  02/20/2021, 4:13 PM  [facility] [address] [place], [place], [address] Phone: [phone]   Fax:  [phone]  Name: Scott Soto MRN: [id number] Date of Birth: 12-Feb-2001

## 2021-02-22 ENCOUNTER — Ambulatory Visit: Payer: Medicaid Other | Admitting: Physical Therapy

## 2021-02-27 ENCOUNTER — Encounter: Payer: Medicaid Other | Admitting: Physical Therapy

## 2021-03-01 ENCOUNTER — Encounter: Payer: Medicaid Other | Admitting: Physical Therapy

## 2021-03-06 ENCOUNTER — Encounter: Payer: Medicaid Other | Admitting: Physical Therapy

## 2021-03-08 ENCOUNTER — Encounter: Payer: Medicaid Other | Admitting: Physical Therapy

## 2021-03-13 ENCOUNTER — Encounter: Payer: Medicaid Other | Admitting: Physical Therapy

## 2021-03-15 ENCOUNTER — Encounter: Payer: Medicaid Other | Admitting: Physical Therapy

## 2021-03-20 ENCOUNTER — Encounter: Payer: Medicaid Other | Admitting: Physical Therapy

## 2021-03-27 ENCOUNTER — Encounter: Payer: Medicaid Other | Admitting: Physical Therapy

## 2021-03-29 ENCOUNTER — Encounter: Payer: Medicaid Other | Admitting: Physical Therapy

## 2021-04-03 ENCOUNTER — Ambulatory Visit: Payer: Medicaid Other | Admitting: Physical Therapy

## 2021-04-05 ENCOUNTER — Encounter: Payer: Medicaid Other | Admitting: Physical Therapy

## 2021-04-10 ENCOUNTER — Encounter: Payer: Medicaid Other | Admitting: Physical Therapy

## 2021-04-12 ENCOUNTER — Encounter: Payer: Medicaid Other | Admitting: Physical Therapy

## 2023-03-24 ENCOUNTER — Ambulatory Visit
Admission: EM | Admit: 2023-03-24 | Discharge: 2023-03-24 | Disposition: A | Discharge status: Home / Self Care | Payer: Medicaid Other | Attending: Internal Medicine | Admitting: Internal Medicine

## 2023-03-24 DIAGNOSIS — R21 Rash and other nonspecific skin eruption: Secondary | ICD-10-CM

## 2023-03-24 HISTORY — DX: Crohn's disease, unspecified, without complications: K50.90

## 2023-03-24 HISTORY — DX: Irritable bowel syndrome, unspecified: K58.9

## 2023-03-24 MED ORDER — TRIAMCINOLONE ACETONIDE 0.1 % EX CREA: 1.0000 | TOPICAL_CREAM | Freq: Two times a day (BID) | CUTANEOUS | 0 refills | Status: AC

## 2023-03-24 NOTE — ED Triage Notes (Signed)
Patient has a hx of IBS and chron's. He gets flare ups. Flare up is on knuckles and fingers. Itches but doesn't hurt.

## 2023-03-24 NOTE — ED Provider Notes (Signed)
[facility]    CSN: [id number] Arrival date & time: 03/24/23  0915      History   Chief Complaint Chief Complaint  Patient presents with   Rash    HPI Scott Soto is a 22 y.o. male presents for rash.  Patient has history of IBS and Crohn's and states with these conditions flareup will get an itchy rash on his hands.  Reports he woke this morning with this rash.  Denies any fevers, swelling, drainage, warmth.  No pain.  No history of eczema or psoriasis.  Has not used any OTC medications for symptoms.  No other concerns at this time.   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 Authorizing Provider  Adalimumab 40 MG/0.4ML PNKT Inject into the skin. 05/05/19  Yes [provider]  triamcinolone cream (KENALOG) 0.1 % Apply 1 Application topically 2 (two) times daily. 03/24/23  Yes Radford Pax, NP  cetirizine (ZYRTEC) 10 MG tablet Take by mouth. 08/18/14   [provider]  cyanocobalamin 1000 MCG tablet Take by mouth. 12/23/17   [provider]  FLUoxetine (PROZAC) 40 MG capsule TAKE 1 CAPSULE BY MOUTH EVERY DAY 09/16/19   Darcel Smalling, MD  hydrOXYzine (ATARAX/VISTARIL) 25 MG tablet Take 1 tablet (25 mg total) by  mouth 3 (three) times daily as needed for anxiety. 08/26/19   Darcel Smalling, MD  hyoscyamine (LEVSIN SL) 0.125 MG SL tablet Place under the tongue. 01/07/18   [provider]  Nutritional Supplements (ENSURE NUTRITION SHAKE) LIQD Take by mouth. 03/31/17   [provider]  omeprazole (PRILOSEC) 20 MG capsule Take by mouth. 02/14/17   [provider]  pantoprazole (PROTONIX) 40 MG tablet Take by mouth. 08/25/18 08/25/19  [provider]  predniSONE (DELTASONE) 10 MG tablet Take 20 mg by mouth daily. 12/05/17   [provider]  predniSONE (DELTASONE) 5 MG tablet TAKE 1 AND 1/2 TABLETS BY MOUTH ONCE DAILY 06/23/19   [provider]  PROAIR HFA 108 (90 Base) MCG/ACT inhaler TAKE 2 PUFFS BY MOUTH EVERY 4 HOURS AS NEEDED 12/23/17   [provider]  traZODone (DESYREL) 50 MG tablet Take 1 tablet (50 mg total) by mouth at bedtime. 08/26/19   Darcel Smalling, MD  tretinoin (RETIN-A) 0.01 % gel Apply topically. 10/30/17   [provider]  inFLIXimab (REMICADE) 100 MG injection Inject into the vein.  03/22/20  [provider]    Family History Family History  Problem Relation Age of Onset   Depression Mother    Anxiety disorder Mother    Anxiety disorder Sister    Depression Sister     Social History Social History  Tobacco Use   Smoking status: Never   Smokeless tobacco: Never  Vaping Use   Vaping status: Never Used  Substance Use Topics   Alcohol use: Never   Drug use: Yes    Types: Marijuana     Allergies   Amoxicillin, Sulfa antibiotics, Silicone, and Tape   Review of Systems Review of Systems  Skin:  Positive for rash.     Physical Exam Triage Vital Signs ED Triage Vitals  Encounter Vitals Group     BP 03/24/23 0952 125/73     Systolic BP Percentile --      Diastolic BP Percentile --      Pulse Rate 03/24/23 0952 (!) 55     Resp 03/24/23 0952 19     Temp 03/24/23 0952 98.2 F (36.8 C)     Temp  Source 03/24/23 0952 Oral     SpO2 03/24/23 0952 97 %     Weight --      Height --      Head Circumference --      Peak Flow --      Pain Score 03/24/23 0949 0     Pain Loc --      Pain Education --      Exclude from Growth Chart --    No data found.  Updated Vital Signs BP 125/73 (BP Location: Right Arm)   Pulse (!) 55   Temp 98.2 F (36.8 C) (Oral)   Resp 19   SpO2 97%   Visual Acuity Right Eye Distance:   Left Eye Distance:   Bilateral Distance:    Right Eye Near:   Left Eye Near:    Bilateral Near:     Physical Exam Vitals and nursing note reviewed.  Constitutional:      General: He is not in acute distress.    Appearance: Normal appearance. He is not ill-appearing.  HENT:     Head: Normocephalic and atraumatic.  Eyes:     Pupils: Pupils are equal, round, and reactive to light.  Cardiovascular:     Rate and Rhythm: Normal rate.  Pulmonary:     Effort: Pulmonary effort is normal.  Skin:    General: Skin is warm and dry.     Findings: Rash is not crusting, nodular, purpuric, pustular, scaling, urticarial or vesicular.     Comments: Mildly erythematous dry slightly peeling rash to bilateral hands.  Neurological:     General: No focal deficit present.     Mental Status: He is alert and oriented to person, place, and time.  Psychiatric:        Mood and Affect: Mood normal.        Behavior: Behavior normal.      UC Treatments / Results  Labs (all labs ordered are listed, but only abnormal results are displayed) Labs Reviewed - No data to display  EKG   Radiology No results found.  Procedures Procedures (including critical care time)  Medications Ordered in UC Medications - No data to display  Initial Impression / Assessment and Plan / UC Course  I have reviewed the triage vital signs and the nursing notes.  Pertinent labs & imaging results that were available during my care of the patient were reviewed by me and considered in my medical  decision making (see chart for details).     Reviewed exam and symptoms with patient.  No red flags.  Will do topical Kenalog twice daily.  Advised OTC Aquaphor lotion as well.  PCP follow-up if symptoms do not improve.  ER precautions reviewed. Final Clinical Impressions(s) / UC Diagnoses   Final diagnoses:  Rash and nonspecific skin eruption     Discharge Instructions      Start Kenalog topical steroid cream twice daily to the rash areas.  Use over-the-counter Aquaphor lotion to help hydrate the area as well.  Please follow-up with your PCP if your symptoms do not improve.  Please go to the ER for any worsening symptoms.  I hope you feel better soon!    ED Prescriptions     Medication Sig Dispense Auth. Provider   triamcinolone cream (KENALOG) 0.1 % Apply 1 Application topically 2 (two) times daily. 45 g Radford Pax, NP      PDMP not reviewed this encounter.   Radford Pax, NP 03/24/23 1003

## 2023-03-24 NOTE — Discharge Instructions (Signed)
Start Kenalog topical steroid cream twice daily to the rash areas.  Use over-the-counter Aquaphor lotion to help hydrate the area as well.  Please follow-up with your PCP if your symptoms do not improve.  Please go to the ER for any worsening symptoms.  I hope you feel better soon!
